# Patient Record
Sex: Female | Born: 1945
Health system: Southern US, Community
[De-identification: ages and names within clinical notes are randomized; demographics above are authoritative.]

## PROBLEM LIST (undated history)

## (undated) DIAGNOSIS — R011 Cardiac murmur, unspecified: Secondary | ICD-10-CM

## (undated) DIAGNOSIS — I10 Essential (primary) hypertension: Secondary | ICD-10-CM

## (undated) DIAGNOSIS — E785 Hyperlipidemia, unspecified: Secondary | ICD-10-CM

## (undated) DIAGNOSIS — T7840XA Allergy, unspecified, initial encounter: Secondary | ICD-10-CM

## (undated) DIAGNOSIS — I341 Nonrheumatic mitral (valve) prolapse: Secondary | ICD-10-CM

## (undated) DIAGNOSIS — N390 Urinary tract infection, site not specified: Secondary | ICD-10-CM

## (undated) DIAGNOSIS — Z8619 Personal history of other infectious and parasitic diseases: Secondary | ICD-10-CM

## (undated) HISTORY — PX: CATARACT EXTRACTION: SUR2

## (undated) HISTORY — DX: Nonrheumatic mitral (valve) prolapse: I34.1

## (undated) HISTORY — PX: OTHER SURGICAL HISTORY: SHX169

## (undated) HISTORY — DX: Cardiac murmur, unspecified: R01.1

## (undated) HISTORY — DX: Essential (primary) hypertension: I10

## (undated) HISTORY — DX: Allergy, unspecified, initial encounter: T78.40XA

## (undated) HISTORY — PX: TONSILLECTOMY AND ADENOIDECTOMY: SHX28

## (undated) HISTORY — DX: Hyperlipidemia, unspecified: E78.5

## (undated) HISTORY — DX: Urinary tract infection, site not specified: N39.0

## (undated) HISTORY — DX: Personal history of other infectious and parasitic diseases: Z86.19

---

## 2011-05-10 HISTORY — PX: OTHER SURGICAL HISTORY: SHX169

## 2011-08-02 DIAGNOSIS — R928 Other abnormal and inconclusive findings on diagnostic imaging of breast: Secondary | ICD-10-CM | POA: Insufficient documentation

## 2011-08-17 DIAGNOSIS — C50919 Malignant neoplasm of unspecified site of unspecified female breast: Secondary | ICD-10-CM | POA: Insufficient documentation

## 2011-09-26 ENCOUNTER — Ambulatory Visit: Payer: Self-pay | Admitting: Radiation Oncology

## 2011-10-08 ENCOUNTER — Ambulatory Visit: Payer: Self-pay | Admitting: Radiation Oncology

## 2011-10-12 LAB — CBC CANCER CENTER
Basophil #: 0.1 x10 3/mm (ref 0.0–0.1)
Eosinophil #: 0.1 x10 3/mm (ref 0.0–0.7)
Eosinophil %: 2 %
HCT: 42.5 % (ref 35.0–47.0)
Lymphocyte %: 25.3 %
MCH: 30.5 pg (ref 26.0–34.0)
MCHC: 32.4 g/dL (ref 32.0–36.0)
MCV: 94 fL (ref 80–100)
Monocyte #: 0.4 x10 3/mm (ref 0.2–0.9)
Neutrophil #: 2.8 x10 3/mm (ref 1.4–6.5)
Neutrophil %: 63.3 %
Platelet: 179 x10 3/mm (ref 150–440)
RDW: 14.2 % (ref 11.5–14.5)

## 2011-10-19 LAB — CBC CANCER CENTER
Basophil #: 0.1 x10 3/mm (ref 0.0–0.1)
Eosinophil %: 1.2 %
HGB: 14.6 g/dL (ref 12.0–16.0)
Lymphocyte #: 1 x10 3/mm (ref 1.0–3.6)
MCH: 30.6 pg (ref 26.0–34.0)
Monocyte #: 0.4 x10 3/mm (ref 0.2–0.9)
Monocyte %: 7.2 %
Platelet: 188 x10 3/mm (ref 150–440)
RBC: 4.78 10*6/uL (ref 3.80–5.20)
RDW: 13.9 % (ref 11.5–14.5)
WBC: 5.4 x10 3/mm (ref 3.6–11.0)

## 2011-10-26 LAB — CBC CANCER CENTER
Basophil #: 0.1 x10 3/mm (ref 0.0–0.1)
Eosinophil %: 1.9 %
HGB: 13.8 g/dL (ref 12.0–16.0)
Lymphocyte #: 0.9 x10 3/mm — ABNORMAL LOW (ref 1.0–3.6)
Lymphocyte %: 20 %
MCH: 31.1 pg (ref 26.0–34.0)
MCHC: 32.7 g/dL (ref 32.0–36.0)
MCV: 95 fL (ref 80–100)
Monocyte #: 0.3 x10 3/mm (ref 0.2–0.9)
Neutrophil #: 3.3 x10 3/mm (ref 1.4–6.5)
Neutrophil %: 69.6 %
RBC: 4.44 10*6/uL (ref 3.80–5.20)
RDW: 14.2 % (ref 11.5–14.5)
WBC: 4.7 x10 3/mm (ref 3.6–11.0)

## 2011-11-02 LAB — CBC CANCER CENTER
Basophil #: 0.1 x10 3/mm (ref 0.0–0.1)
Eosinophil #: 0.1 x10 3/mm (ref 0.0–0.7)
Eosinophil %: 3.7 %
HCT: 44.8 % (ref 35.0–47.0)
HGB: 14.6 g/dL (ref 12.0–16.0)
Lymphocyte %: 19.4 %
Monocyte #: 0.4 x10 3/mm (ref 0.2–0.9)
Monocyte %: 10.9 %
RDW: 14 % (ref 11.5–14.5)
WBC: 3.8 x10 3/mm (ref 3.6–11.0)

## 2011-11-07 ENCOUNTER — Ambulatory Visit: Payer: Self-pay | Admitting: Radiation Oncology

## 2011-11-09 LAB — CBC CANCER CENTER
Basophil #: 0.1 x10 3/mm (ref 0.0–0.1)
Eosinophil #: 0.1 x10 3/mm (ref 0.0–0.7)
Lymphocyte #: 0.8 x10 3/mm — ABNORMAL LOW (ref 1.0–3.6)
Lymphocyte %: 19.5 %
MCHC: 32.3 g/dL (ref 32.0–36.0)
Monocyte %: 10.8 %
Neutrophil %: 65.8 %
Platelet: 164 x10 3/mm (ref 150–440)
RDW: 14.3 % (ref 11.5–14.5)
WBC: 4.3 x10 3/mm (ref 3.6–11.0)

## 2011-11-16 LAB — CBC CANCER CENTER
Basophil #: 0 x10 3/mm (ref 0.0–0.1)
Basophil %: 1.3 %
Eosinophil #: 0.1 x10 3/mm (ref 0.0–0.7)
HCT: 45.3 % (ref 35.0–47.0)
HGB: 14.7 g/dL (ref 12.0–16.0)
Lymphocyte #: 0.7 x10 3/mm — ABNORMAL LOW (ref 1.0–3.6)
Lymphocyte %: 19.1 %
Monocyte %: 11.4 %
Neutrophil #: 2.4 x10 3/mm (ref 1.4–6.5)
Neutrophil %: 66.1 %
Platelet: 159 x10 3/mm (ref 150–440)
RDW: 13.9 % (ref 11.5–14.5)
WBC: 3.6 x10 3/mm (ref 3.6–11.0)

## 2011-11-22 LAB — CBC CANCER CENTER
Basophil %: 1.1 %
Eosinophil %: 1.9 %
HGB: 14.5 g/dL (ref 12.0–16.0)
Lymphocyte #: 0.7 x10 3/mm — ABNORMAL LOW (ref 1.0–3.6)
MCH: 30.9 pg (ref 26.0–34.0)
MCV: 95 fL (ref 80–100)
Monocyte %: 9.3 %
Neutrophil %: 73.4 %
Platelet: 153 x10 3/mm (ref 150–440)
RBC: 4.68 10*6/uL (ref 3.80–5.20)
WBC: 4.8 x10 3/mm (ref 3.6–11.0)

## 2011-12-08 ENCOUNTER — Ambulatory Visit: Payer: Self-pay | Admitting: Radiation Oncology

## 2012-01-08 ENCOUNTER — Ambulatory Visit: Payer: Self-pay | Admitting: Radiation Oncology

## 2012-03-30 ENCOUNTER — Ambulatory Visit: Payer: Self-pay | Admitting: Radiation Oncology

## 2012-07-10 ENCOUNTER — Ambulatory Visit: Payer: Self-pay | Admitting: Radiation Oncology

## 2012-09-03 ENCOUNTER — Ambulatory Visit: Payer: Self-pay | Admitting: Radiation Oncology

## 2012-09-05 ENCOUNTER — Ambulatory Visit: Payer: Self-pay | Admitting: Radiation Oncology

## 2014-08-31 NOTE — Consult Note (Signed)
Reason for Visit: This 69 year old Female patient presents to the clinic for initial evaluation of  Breast cancer .   Referred by Dr. Ladona Horns [facility].  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 Core biopsy was recommended and performed and was positive for ductal carcinoma in situ nuclear grade 2-3 with necrosis present. Tumor was approximately 1.6 cm in greatest dimension. Tumor was ER/PR negative. Original margins were positive she underwent reexcision with clear margins the closest being 2.5 mm. She has tolerated her surgery well. She seen today for consideration of whole breast radiation. She specifically denies significant pain in the right breast cough or bone pain. She still somewhat tender from surgery.  Past, Family and Social History:   Past Medical History positive    Past Surgical History Prior benign breast biopsy in the 29s    Past Medical History Comments Early osteoporosis    Family History positive    Family History Comments Maternal aunt with breast cancer and maternal grandmother with uterine cancer    Social History noncontributory    Additional Past Medical and Surgical History Accompanied by husband today   Review of Systems:   General negative    Performance Status (ECOG) 0    Skin negative    Breast see HPI    Ophthalmologic negative    ENMT negative    Respiratory and Thorax negative    Cardiovascular  negative    Gastrointestinal negative    Genitourinary negative    Musculoskeletal negative    Neurological negative    Psychiatric negative    Hematology/Lymphatics negative    Endocrine negative    Allergic/Immunologic negative   Physical Exam:  General/Skin/HEENT:   General normal    Skin normal    Eyes normal    ENMT normal    Head and Neck normal    Additional PE Well-developed well-nourished thin female in NAD. Breasts are small. She is status post recent wide local excision of the right breast superior to the area lower nipple complex. Incision is well-healed. No dominant mass or nodularity is noted in either breast into position examined. No axillary or supraclavicular adenopathy is appreciated. Lungs are clear to A&P cardiac examination shows regular rate and rhythm.   Breasts/Resp/CV/GI/GU:   Respiratory and Thorax normal    Cardiovascular normal    Gastrointestinal normal    Genitourinary normal   MS/Neuro/Psych/Lymph:   Musculoskeletal normal    Neurological normal    Lymphatics normal   Assessment and Plan:  Impression:   stage 0 ductal carcinomas site to of the right breast status post wide local excision.  Plan:   at the present time I would favor going ahead with whole breast radiation to 5000 cGy. Would also boost or scar another 1400 cGy based on the close margin of 2.5 mm. Risks and benefits of treatment including slight skin reaction, fatigue and small inclusion superficial lung were all discussed with  the patient and her husband. They both seem to comprehend my treatment plan well. I have set her up for CT simulation later this week.patient has inquired MammoSite catheter placement although her breasts are too small for catheter placement and would not provide enough spare spacing between her chest wall and skin.  I would like to take this opportunity to thank you for allowing me to continue to participate in this patient's care.  CC  Referral:   cc: Dr. Ladona Horns [facility], Dr. Francoise Schaumann   Electronic Signatures: Armstead Peaks (MD)  (Signed 20-May-13 13:43)  Authored: HPI, Diagnosis, PFSH, ROS, Physical Exam, Encounter Assessment and Plan, CC Referring Physician   Last Updated: 20-May-13 13:43 by Armstead Peaks (MD)

## 2014-11-03 DIAGNOSIS — H524 Presbyopia: Secondary | ICD-10-CM | POA: Diagnosis not present

## 2014-11-03 DIAGNOSIS — H52223 Regular astigmatism, bilateral: Secondary | ICD-10-CM | POA: Diagnosis not present

## 2014-11-03 DIAGNOSIS — H5203 Hypermetropia, bilateral: Secondary | ICD-10-CM | POA: Diagnosis not present

## 2014-11-03 DIAGNOSIS — H2513 Age-related nuclear cataract, bilateral: Secondary | ICD-10-CM | POA: Diagnosis not present

## 2014-12-02 DIAGNOSIS — Z9889 Other specified postprocedural states: Secondary | ICD-10-CM | POA: Diagnosis not present

## 2014-12-02 DIAGNOSIS — R922 Inconclusive mammogram: Secondary | ICD-10-CM | POA: Diagnosis not present

## 2014-12-02 DIAGNOSIS — C50911 Malignant neoplasm of unspecified site of right female breast: Secondary | ICD-10-CM | POA: Diagnosis not present

## 2014-12-16 DIAGNOSIS — L570 Actinic keratosis: Secondary | ICD-10-CM | POA: Diagnosis not present

## 2014-12-16 DIAGNOSIS — L821 Other seborrheic keratosis: Secondary | ICD-10-CM | POA: Diagnosis not present

## 2014-12-16 DIAGNOSIS — X32XXXA Exposure to sunlight, initial encounter: Secondary | ICD-10-CM | POA: Diagnosis not present

## 2014-12-16 DIAGNOSIS — L578 Other skin changes due to chronic exposure to nonionizing radiation: Secondary | ICD-10-CM | POA: Diagnosis not present

## 2014-12-16 DIAGNOSIS — L738 Other specified follicular disorders: Secondary | ICD-10-CM | POA: Diagnosis not present

## 2015-03-14 DIAGNOSIS — Z23 Encounter for immunization: Secondary | ICD-10-CM | POA: Diagnosis not present

## 2015-05-21 ENCOUNTER — Emergency Department: Payer: Medicare Other

## 2015-05-21 ENCOUNTER — Encounter: Payer: Self-pay | Admitting: *Deleted

## 2015-05-21 ENCOUNTER — Emergency Department
Admission: EM | Admit: 2015-05-21 | Discharge: 2015-05-21 | Disposition: A | Discharge status: Home / Self Care | Payer: Medicare Other | Attending: Emergency Medicine | Admitting: Emergency Medicine

## 2015-05-21 DIAGNOSIS — R1013 Epigastric pain: Secondary | ICD-10-CM | POA: Diagnosis not present

## 2015-05-21 DIAGNOSIS — R1010 Upper abdominal pain, unspecified: Secondary | ICD-10-CM | POA: Diagnosis present

## 2015-05-21 DIAGNOSIS — Z880 Allergy status to penicillin: Secondary | ICD-10-CM | POA: Insufficient documentation

## 2015-05-21 DIAGNOSIS — R1033 Periumbilical pain: Secondary | ICD-10-CM | POA: Diagnosis not present

## 2015-05-21 LAB — BASIC METABOLIC PANEL
Anion gap: 8 (ref 5–15)
BUN: 19 mg/dL (ref 6–20)
CHLORIDE: 106 mmol/L (ref 101–111)
CO2: 28 mmol/L (ref 22–32)
CREATININE: 0.61 mg/dL (ref 0.44–1.00)
Calcium: 9.3 mg/dL (ref 8.9–10.3)
GFR calc non Af Amer: >60 mL/min (ref >60)
Glucose, Bld: 97 mg/dL (ref 65–99)
POTASSIUM: 3.4 mmol/L — AB (ref 3.5–5.1)
SODIUM: 142 mmol/L (ref 135–145)

## 2015-05-21 LAB — CBC
HEMATOCRIT: 43 % (ref 35.0–47.0)
Hemoglobin: 14.3 g/dL (ref 12.0–16.0)
MCH: 31.3 pg (ref 26.0–34.0)
MCHC: 33.3 g/dL (ref 32.0–36.0)
MCV: 93.9 fL (ref 80.0–100.0)
PLATELETS: 175 10*3/uL (ref 150–440)
RBC: 4.58 MIL/uL (ref 3.80–5.20)
RDW: 13.8 % (ref 11.5–14.5)
WBC: 5.5 10*3/uL (ref 3.6–11.0)

## 2015-05-21 LAB — HEPATIC FUNCTION PANEL
ALBUMIN: 4.5 g/dL (ref 3.5–5.0)
ALK PHOS: 62 U/L (ref 38–126)
ALT: 17 U/L (ref 14–54)
AST: 28 U/L (ref 15–41)
BILIRUBIN TOTAL: 0.8 mg/dL (ref 0.3–1.2)
Total Protein: 6.7 g/dL (ref 6.5–8.1)

## 2015-05-21 LAB — TROPONIN I: Troponin I: <0.03 ng/mL (ref <0.031)

## 2015-05-21 LAB — LIPASE, BLOOD: Lipase: 24 U/L (ref 11–51)

## 2015-05-21 NOTE — ED Notes (Signed)
Lab notified of add on labs.

## 2015-05-21 NOTE — ED Provider Notes (Signed)
[facility] Emergency Department Provider Note  Time seen: 4:15 PM  I have reviewed the triage vital signs and the nursing notes.   HISTORY  Chief Complaint Abdominal Pain and Gastroesophageal Reflux    HPI Maria Gonzalez is a 70 y.o. female with no past medical history who presents to the emergency department with upper abdominal discomfort which started at 1:30 PM. According to the patient she was out in her yard she had bent over and when she stood up she felt a sharp pain in her upper abdomen. States it progressed to an 8/10 pain which she describes as sharp. Since then the pain has decreased and she states this is very mild at this time. The patient states she took her blood pressure was 160/90, she became concerned and came to the emergency department for further evaluation. States the pain is very mild, 1/10 down from an 8/10 earlier. Denies any radiation into her chest. Denies any nausea or shortness of breath. Denies any recent dysuria, diarrhea or vomiting.    History reviewed. No pertinent past medical history.  There are no active problems to display for this patient.   No past surgical history on file.  No current outpatient prescriptions on file.  Allergies Penicillins and Sulfa antibiotics  History reviewed. No pertinent family history.  Social History Social History  Substance Use Topics  . Smoking status: None  . Smokeless tobacco: None  . Alcohol Use: None    Review of Systems Constitutional: Negative for fever. Cardiovascular: Negative for chest pain. Respiratory: Negative for shortness of breath. Gastrointestinal: Positive for abdominal pain in the upper abdomen, largely resolved at this time. Denies nausea, vomiting, diarrhea. Genitourinary: Negative for dysuria. Neurological: Negative for headache 10-point ROS otherwise negative.  ____________________________________________   PHYSICAL EXAM:  VITAL SIGNS: ED Triage  Vitals  Enc Vitals Group     BP 05/21/15 1503 165/77 mmHg     Pulse Rate 05/21/15 1503 101     Resp 05/21/15 1503 18     Temp 05/21/15 1503 98.8 F (37.1 C)     Temp Source 05/21/15 1503 Oral     SpO2 05/21/15 1503 98 %     Weight 05/21/15 1503 122 lb (55.339 kg)     Height 05/21/15 1503 5\' 5"  (1.651 m)     Head Cir --      Peak Flow --      Pain Score 05/21/15 1503 2     Pain Loc --      Pain Edu? --      Excl. in [place]? --     Constitutional: Alert and oriented. Well appearing and in no distress. Eyes: Normal exam ENT   Head: Normocephalic and atraumatic.   Mouth/Throat: Mucous membranes are moist. Cardiovascular: Normal rate, regular rhythm. No murmur Respiratory: Normal respiratory effort without tachypnea nor retractions. Breath sounds are clear and equal bilaterally. No wheezes/rales/rhonchi. Gastrointestinal: Soft, minimal periumbilical tenderness palpation. No rebound or guarding. Abdomen otherwise benign. Musculoskeletal: Nontender with normal range of motion in all extremities. No lower extremity edema or tenderness. Neurologic:  Normal speech and language. No gross focal neurologic deficits Skin:  Skin is warm, dry and intact.  Psychiatric: Mood and affect are normal. Speech and behavior are normal.  ____________________________________________    EKG  EKG reviewed and interpreted by myself shows sinus tachycardia 103 bpm, narrow QRS, normal axis, normal intervals, nonspecific ST changes. No ST elevations.  ____________________________________________   INITIAL IMPRESSION / ASSESSMENT AND PLAN /  ED COURSE  Pertinent labs & imaging results that were available during my care of the patient were reviewed by me and considered in my medical decision making (see chart for details).  Patient presents to the emergency department with upper abdominal pain which began at 1:30 PM after standing up abruptly in her yard. States the pain has largely resolved. We will  check labs including hepatic function panel and lipase, as well as troponin. Patient's EKG is normal besides mild tachycardia. Patient appears overall very well currently, no distress. Bedside ultrasound performed by myself shows a normal-appearing gallbladder, no stone shadows with a normal gallbladder wall thickness. Aorta evaluation shows what appears to be a normal caliber aorta without dilation.  Labs are all within normal limits. Patient states the pain is completely gone and is asking to be discharged home. It is unclear the cause of the patient's pain however her workup is reassuring, and the patient looks very well with no distress. We'll discharge home. I discussed my normal abdominal pain return precautions with the patient who is agreeable.  ____________________________________________   FINAL CLINICAL IMPRESSION(S) / ED DIAGNOSES  Abdominal pain   Harvest Dark, MD 05/21/15 [phone]

## 2015-05-21 NOTE — ED Notes (Signed)
MD at bedside. 

## 2015-05-21 NOTE — ED Notes (Signed)
Pt states upper abd pain and indigestion beginning at 1300, states she took 81 mg ASa and peptobismal, pt deneis any SOB or nausea

## 2015-05-21 NOTE — Discharge Instructions (Signed)

## 2015-06-10 DIAGNOSIS — H5203 Hypermetropia, bilateral: Secondary | ICD-10-CM | POA: Diagnosis not present

## 2015-06-10 DIAGNOSIS — H04123 Dry eye syndrome of bilateral lacrimal glands: Secondary | ICD-10-CM | POA: Diagnosis not present

## 2015-06-10 DIAGNOSIS — H2513 Age-related nuclear cataract, bilateral: Secondary | ICD-10-CM | POA: Diagnosis not present

## 2015-06-10 DIAGNOSIS — H43393 Other vitreous opacities, bilateral: Secondary | ICD-10-CM | POA: Diagnosis not present

## 2015-06-10 DIAGNOSIS — H52223 Regular astigmatism, bilateral: Secondary | ICD-10-CM | POA: Diagnosis not present

## 2015-06-10 DIAGNOSIS — H1045 Other chronic allergic conjunctivitis: Secondary | ICD-10-CM | POA: Diagnosis not present

## 2016-03-09 DIAGNOSIS — Z23 Encounter for immunization: Secondary | ICD-10-CM | POA: Diagnosis not present

## 2016-03-14 DIAGNOSIS — J988 Other specified respiratory disorders: Secondary | ICD-10-CM | POA: Diagnosis not present

## 2016-03-21 ENCOUNTER — Ambulatory Visit (INDEPENDENT_AMBULATORY_CARE_PROVIDER_SITE_OTHER): Payer: Medicare Other | Admitting: Family

## 2016-03-21 ENCOUNTER — Encounter: Payer: Self-pay | Admitting: Family

## 2016-03-21 ENCOUNTER — Telehealth: Payer: Self-pay | Admitting: *Deleted

## 2016-03-21 VITALS — BP 152/70 | HR 92 | Temp 98.6°F | Ht 65.0 in | Wt 125.2 lb

## 2016-03-21 DIAGNOSIS — Z7689 Persons encountering health services in other specified circumstances: Secondary | ICD-10-CM

## 2016-03-21 DIAGNOSIS — Z0000 Encounter for general adult medical examination without abnormal findings: Secondary | ICD-10-CM | POA: Insufficient documentation

## 2016-03-21 NOTE — Telephone Encounter (Signed)
Please advise 

## 2016-03-21 NOTE — Progress Notes (Signed)
Subjective:    Patient ID: Maria Gonzalez, female    DOB: 07/30/1945, 70 y.o.   MRN: [id number]  CC: Maria Gonzalez is a 70 y.o. female who presents today to establish care.    HPI: Patient presents as new patient to establish care. Hasn't had recent care since prior PCP , Dr. Yetta Flock retired.   Feeling well today. BP at Urgent care last week 120/80 per patient. Has never been on medication for blood pressure. 'Nervous about appointment today.'  Denies exertional chest pain or pressure, numbness or tingling radiating to left arm or jaw, palpitations, dizziness, frequent headaches, changes in vision, or shortness of breath.          HISTORY:  Past Medical History:  Diagnosis Date  . Allergy   . Heart murmur   . History of chicken pox   . Hyperlipidemia   . UTI (urinary tract infection)    Past Surgical History:  Procedure Laterality Date  . right breast biopsy  2013   right breast calcification s/p radiation  . TONSILLECTOMY AND ADENOIDECTOMY     Family History  Problem Relation Age of Onset  . Hypertension Mother   . Stroke Mother   . Heart disease Father   . Cervical cancer Maternal Grandmother 60  . Stroke Maternal Grandfather     Allergies: Penicillins and Sulfa antibiotics No current outpatient prescriptions on file prior to visit.   No current facility-administered medications on file prior to visit.     Social History  Substance Use Topics  . Smoking status: Never Smoker  . Smokeless tobacco: Never Used  . Alcohol use No    Review of Systems  Constitutional: Negative for chills and fever.  Eyes: Negative for visual disturbance.  Respiratory: Negative for cough.   Cardiovascular: Negative for chest pain and palpitations.  Gastrointestinal: Negative for nausea and vomiting.  Neurological: Negative for dizziness and headaches.      Objective:    BP (!) 152/70   Pulse 92   Temp 98.6 F (37 C) (Oral)   Ht 5\' 5"  (1.651 m)   Wt 125 lb 3.2 oz (56.8 kg)    SpO2 97%   BMI 20.83 kg/m  BP Readings from Last 3 Encounters:  03/21/16 (!) 152/70  05/21/15 (!) 143/71   Wt Readings from Last 3 Encounters:  03/21/16 125 lb 3.2 oz (56.8 kg)  05/21/15 122 lb (55.3 kg)    Physical Exam  Constitutional: She appears well-developed and well-nourished.  Eyes: Conjunctivae are normal.  Cardiovascular: Normal rate, regular rhythm, normal heart sounds and normal pulses.   Pulmonary/Chest: Effort normal and breath sounds normal. She has no wheezes. She has no rhonchi. She has no rales.  Neurological: She is alert.  Skin: Skin is warm and dry.  Psychiatric: She has a normal mood and affect. Her speech is normal and behavior is normal. Thought content normal.  Vitals reviewed.      Assessment & Plan:   Problem List Items Addressed This Visit      Other   Encounter to establish care - Primary    Reviewed past medical and family history with patient. Blood pressure was mildly elevated today. Advised patient to use blood pressure cuff at home and monitor daily for the next week and call me with values. She verbalized understanding. F/u with CPE and labs. Due for colonoscopy and mammogram. Both ordered today.      Relevant Orders   MM DIGITAL SCREENING BILATERAL   Ambulatory referral  to Gastroenterology       I am having Ms. Pilgrim maintain her acetaminophen, Magnesium, Fish Oil, Vitamin B-12 CR, Calcium Carbonate-Vitamin D, multivitamin, aspirin EC, and vitamin E.   Meds ordered this encounter  Medications  . acetaminophen (TYLENOL) 325 MG tablet    Sig: Take 325 mg by mouth.  . Magnesium 250 MG TABS    Sig: Frequency:PRN   Dosage:0.0     Instructions:  Note:Dose: 1-2 TABS  . Omega-3 Fatty Acids (FISH OIL) 1000 MG CAPS    Sig: Take by mouth.  . Cyanocobalamin (VITAMIN B-12 CR) 1000 MCG TBCR    Sig: Take by mouth.  . Calcium Carbonate-Vitamin D 600-400 MG-UNIT tablet    Sig: Take by mouth.  . Multiple Vitamin (MULTIVITAMIN) capsule     Sig: Take 1 capsule by mouth daily.  Marland Kitchen aspirin EC 81 MG tablet    Sig: Take 81 mg by mouth daily.  . vitamin E 400 UNIT capsule    Sig: Take 400 Units by mouth daily.    Return precautions given.   Risks, benefits, and alternatives of the medications and treatment plan prescribed today were discussed, and patient expressed understanding.   Education regarding symptom management and diagnosis given to patient on AVS.  Continue to follow with Mable Paris, FNP for routine health maintenance.   Jeannette How and I agreed with plan.   Mable Paris, FNP

## 2016-03-21 NOTE — Telephone Encounter (Signed)
Pt requested to have her diagnostic mammogram order faxed to [facility]

## 2016-03-21 NOTE — Assessment & Plan Note (Addendum)
Reviewed past medical and family history with patient. Blood pressure was mildly elevated today. Advised patient to use blood pressure cuff at home and monitor daily for the next week and call me with values. She verbalized understanding. F/u with CPE and labs. Due for colonoscopy and mammogram. Both ordered today.

## 2016-03-21 NOTE — Progress Notes (Signed)
Pre visit review using our clinic review tool, if applicable. No additional management support is needed unless otherwise documented below in the visit note. 

## 2016-03-21 NOTE — Patient Instructions (Addendum)
Please return for physical  Watch BP, goal < 140/90.  Call me with values over a week.

## 2016-03-23 ENCOUNTER — Telehealth: Payer: Self-pay

## 2016-03-23 DIAGNOSIS — L820 Inflamed seborrheic keratosis: Secondary | ICD-10-CM | POA: Diagnosis not present

## 2016-03-23 DIAGNOSIS — X32XXXA Exposure to sunlight, initial encounter: Secondary | ICD-10-CM | POA: Diagnosis not present

## 2016-03-23 DIAGNOSIS — L570 Actinic keratosis: Secondary | ICD-10-CM | POA: Diagnosis not present

## 2016-03-23 DIAGNOSIS — R928 Other abnormal and inconclusive findings on diagnostic imaging of breast: Secondary | ICD-10-CM

## 2016-03-23 DIAGNOSIS — L738 Other specified follicular disorders: Secondary | ICD-10-CM | POA: Diagnosis not present

## 2016-03-23 DIAGNOSIS — Z1231 Encounter for screening mammogram for malignant neoplasm of breast: Secondary | ICD-10-CM

## 2016-03-23 DIAGNOSIS — L538 Other specified erythematous conditions: Secondary | ICD-10-CM | POA: Diagnosis not present

## 2016-03-23 DIAGNOSIS — L298 Other pruritus: Secondary | ICD-10-CM | POA: Diagnosis not present

## 2016-03-23 NOTE — Telephone Encounter (Signed)
Needed correct order placed to be faxed. Please advise.

## 2016-03-24 ENCOUNTER — Encounter: Payer: Self-pay | Admitting: Gastroenterology

## 2016-04-04 DIAGNOSIS — C50919 Malignant neoplasm of unspecified site of unspecified female breast: Secondary | ICD-10-CM | POA: Diagnosis not present

## 2016-04-04 DIAGNOSIS — R928 Other abnormal and inconclusive findings on diagnostic imaging of breast: Secondary | ICD-10-CM | POA: Diagnosis not present

## 2016-04-04 DIAGNOSIS — R922 Inconclusive mammogram: Secondary | ICD-10-CM | POA: Diagnosis not present

## 2016-04-04 LAB — HM MAMMOGRAPHY

## 2016-04-05 ENCOUNTER — Ambulatory Visit: Payer: Medicare Other | Admitting: Gastroenterology

## 2016-04-06 ENCOUNTER — Encounter: Payer: Self-pay | Admitting: Family

## 2016-04-06 NOTE — Telephone Encounter (Signed)
Patient has already been informed today by [facility].

## 2016-04-06 NOTE — Telephone Encounter (Signed)
Please call pt and let know mammogram benign. Radiologist notes dense breasts. No suspicious masses She should get report from [facility] as well; let us know if not.

## 2016-04-22 ENCOUNTER — Ambulatory Visit: Payer: Medicare Other | Admitting: Gastroenterology

## 2016-05-03 ENCOUNTER — Telehealth: Payer: Self-pay | Admitting: Family

## 2016-05-03 DIAGNOSIS — Z1239 Encounter for other screening for malignant neoplasm of breast: Secondary | ICD-10-CM

## 2016-05-03 NOTE — Telephone Encounter (Signed)
Pt needs a order in for a mammo for November 2018 at [facility]. Thank you!

## 2016-05-03 NOTE — Telephone Encounter (Signed)
Please advise 

## 2016-05-07 NOTE — Telephone Encounter (Signed)
Let pt know I have ordered

## 2016-05-20 NOTE — Telephone Encounter (Signed)
FYI..Pt called and would like to have a shingles vaccination on her appointment with Joycelyn Schmid on 1/25.

## 2016-05-21 NOTE — Telephone Encounter (Signed)
fyi

## 2016-05-30 NOTE — Progress Notes (Deleted)
Subjective:    Patient ID: Maria Gonzalez, female    DOB: 01-22-1946, 71 y.o.   MRN: [id number]  CC: Maria Gonzalez is a 71 y.o. female who presents today for physical exam.    HPI: Desires shingles vaccine    Colorectal Cancer Screening: UTD *** Breast Cancer Screening: Mammogram UTD*** Cervical Cancer Screening: UTD*** Bone Health screening/DEXA for 65+: No increased fracture risk. Defer screening at this time.*** Lung Cancer Screening: Doesn't have 30 year pack year history and age > 41 years.*** Immunizations       Tetanus -         Pneumococcal - Candidate for. ***       Zoster- Hepatitis C screening - Candidate for*** HIV Screening- Candidate for *** Labs: Screening labs today. Exercise: Gets regular exercise.  Alcohol use: *** Smoking/tobacco use: Nonsmoker.  Regular dental exams: In need of dental exam.*** Wears seat belt: Yes.  HISTORY:  Past Medical History:  Diagnosis Date  . Allergy   . Heart murmur   . History of chicken pox   . Hyperlipidemia   . UTI (urinary tract infection)     Past Surgical History:  Procedure Laterality Date  . right breast biopsy  2013   right breast calcification s/p radiation  . TONSILLECTOMY AND ADENOIDECTOMY     Family History  Problem Relation Age of Onset  . Hypertension Mother   . Stroke Mother   . Heart disease Father   . Cervical cancer Maternal Grandmother 60  . Stroke Maternal Grandfather       ALLERGIES: Penicillins and Sulfa antibiotics  Current Outpatient Prescriptions on File Prior to Visit  Medication Sig Dispense Refill  . acetaminophen (TYLENOL) 325 MG tablet Take 325 mg by mouth.    Marland Kitchen aspirin EC 81 MG tablet Take 81 mg by mouth daily.    . Calcium Carbonate-Vitamin D 600-400 MG-UNIT tablet Take by mouth.    . Cyanocobalamin (VITAMIN B-12 CR) 1000 MCG TBCR Take by mouth.    . Magnesium 250 MG TABS Frequency:PRN   Dosage:0.0     Instructions:  Note:Dose: 1-2 TABS    . Multiple Vitamin (MULTIVITAMIN)  capsule Take 1 capsule by mouth daily.    . Omega-3 Fatty Acids (FISH OIL) 1000 MG CAPS Take by mouth.    . vitamin E 400 UNIT capsule Take 400 Units by mouth daily.     No current facility-administered medications on file prior to visit.     Social History  Substance Use Topics  . Smoking status: Never Smoker  . Smokeless tobacco: Never Used  . Alcohol use No    Review of Systems    Objective:    There were no vitals taken for this visit.  BP Readings from Last 3 Encounters:  03/21/16 (!) 152/70  05/21/15 (!) 143/71   Wt Readings from Last 3 Encounters:  03/21/16 125 lb 3.2 oz (56.8 kg)  05/21/15 122 lb (55.3 kg)    Physical Exam     Assessment & Plan:   Problem List Items Addressed This Visit    None       I am having Ms. Westling maintain her acetaminophen, Magnesium, Fish Oil, Vitamin B-12 CR, Calcium Carbonate-Vitamin D, multivitamin, aspirin EC, and vitamin E.   No orders of the defined types were placed in this encounter.   Return precautions given.   Risks, benefits, and alternatives of the medications and treatment plan prescribed today were discussed, and patient expressed understanding.   Education regarding  symptom management and diagnosis given to patient on AVS.   Continue to follow with Mable Paris, FNP for routine health maintenance.   Jeannette How and I agreed with plan.   Mable Paris, FNP

## 2016-06-02 ENCOUNTER — Encounter: Payer: Medicare Other | Admitting: Family

## 2016-06-02 ENCOUNTER — Inpatient Hospital Stay
Admission: RE | Admit: 2016-06-02 | Discharge: 2016-06-02 | Disposition: A | Discharge status: Home / Self Care | Payer: Self-pay | Source: Ambulatory Visit | Attending: *Deleted | Admitting: *Deleted

## 2016-06-02 ENCOUNTER — Ambulatory Visit (INDEPENDENT_AMBULATORY_CARE_PROVIDER_SITE_OTHER): Payer: Medicare Other | Admitting: Family

## 2016-06-02 ENCOUNTER — Other Ambulatory Visit (HOSPITAL_COMMUNITY)
Admission: RE | Admit: 2016-06-02 | Discharge: 2016-06-02 | Disposition: A | Discharge status: Home / Self Care | Payer: Medicare Other | Source: Ambulatory Visit | Attending: Family | Admitting: Family

## 2016-06-02 ENCOUNTER — Encounter: Payer: Self-pay | Admitting: Family

## 2016-06-02 ENCOUNTER — Other Ambulatory Visit: Payer: Self-pay | Admitting: *Deleted

## 2016-06-02 VITALS — BP 146/62 | HR 82 | Temp 98.2°F | Resp 16 | Ht 65.0 in | Wt 121.1 lb

## 2016-06-02 DIAGNOSIS — Z0000 Encounter for general adult medical examination without abnormal findings: Secondary | ICD-10-CM | POA: Diagnosis not present

## 2016-06-02 DIAGNOSIS — Z9289 Personal history of other medical treatment: Secondary | ICD-10-CM

## 2016-06-02 DIAGNOSIS — Z1151 Encounter for screening for human papillomavirus (HPV): Secondary | ICD-10-CM | POA: Insufficient documentation

## 2016-06-02 DIAGNOSIS — I10 Essential (primary) hypertension: Secondary | ICD-10-CM

## 2016-06-02 DIAGNOSIS — Z01419 Encounter for gynecological examination (general) (routine) without abnormal findings: Secondary | ICD-10-CM | POA: Insufficient documentation

## 2016-06-02 DIAGNOSIS — F411 Generalized anxiety disorder: Secondary | ICD-10-CM | POA: Diagnosis not present

## 2016-06-02 MED ORDER — METOPROLOL SUCCINATE ER 25 MG PO TB24: 25.0000 mg | ORAL_TABLET | Freq: Every day | ORAL | 3 refills | Status: DC

## 2016-06-02 NOTE — Assessment & Plan Note (Signed)
No family history of colon cancer or personal history of polyps. Patient is a good candidate for cologuard especially she is unable to colonoscopy due to her husband being sick right now. Mammogram up-to-date. Patient declined breast exam today. Pap performed. DEXA ordered. No smoking history. Advised patient to have her Prevnar 13 and zoster vaccines at her local pharmacy as we have some concern if it would be  covered during this visit. Encouraged a walking program.

## 2016-06-02 NOTE — Progress Notes (Signed)
Pre visit review using our clinic review tool, if applicable. No additional management support is needed unless otherwise documented below in the visit note. 

## 2016-06-02 NOTE — Progress Notes (Addendum)
Subjective:    Patient ID: Maria Gonzalez, female    DOB: 08-22-1945, 71 y.o.   MRN: [id number]  CC: Maria Gonzalez is a 71 y.o. female who presents today for physical exam.    HPI: Overall feeling well. Notes some stress with husband being sick. No thoughts of hurting herself or anyone else. Prefers to not to medication.  Blood pressures at home have been averaging between 129/76-138/67. No chest pain, shortness of breath. Heart rate has been averaging 71    Colorectal Cancer Screening: due; prefers cologuard. No h/o  Breast Cancer Screening: Mammogram UTD, 03/2016 Cervical Cancer Screening: due; last 2013, normal. He[place] screening/DEXA for 65+: Last dexa 2013, unsure of results.  Lung Cancer Screening: Doesn't have 30 year pack year history and age > 7 years. Immunizations       Tetanus - due        Pneumococcal - Candidate for.  Hepatitis C screening - Candidate for  Labs: Screening labs today. Exercise: Gets regular exercise, walking  Alcohol use: none Smoking/tobacco use: Nonsmoker.   Wears seat belt: Yes.  HISTORY:  Past Medical History:  Diagnosis Date  . Allergy   . Heart murmur   . History of chicken pox   . Hyperlipidemia   . UTI (urinary tract infection)     Past Surgical History:  Procedure Laterality Date  . right breast biopsy  2013   right breast calcification s/p radiation  . TONSILLECTOMY AND ADENOIDECTOMY     Family History  Problem Relation Age of Onset  . Hypertension Mother   . Stroke Mother   . Heart disease Father   . Cervical cancer Maternal Grandmother 60  . Stroke Maternal Grandfather   . Colon cancer Neg Hx       ALLERGIES: Penicillins and Sulfa antibiotics  Current Outpatient Prescriptions on File Prior to Visit  Medication Sig Dispense Refill  . acetaminophen (TYLENOL) 325 MG tablet Take 325 mg by mouth.    Marland Kitchen aspirin EC 81 MG tablet Take 81 mg by mouth daily.    . Calcium Carbonate-Vitamin D 600-400 MG-UNIT tablet Take  by mouth.    . Cyanocobalamin (VITAMIN B-12 CR) 1000 MCG TBCR Take by mouth.    . Magnesium 250 MG TABS Frequency:PRN   Dosage:0.0     Instructions:  Note:Dose: 1-2 TABS    . Multiple Vitamin (MULTIVITAMIN) capsule Take 1 capsule by mouth daily.    . Omega-3 Fatty Acids (FISH OIL) 1000 MG CAPS Take by mouth.    . vitamin E 400 UNIT capsule Take 400 Units by mouth daily.     No current facility-administered medications on file prior to visit.     Social History  Substance Use Topics  . Smoking status: Never Smoker  . Smokeless tobacco: Never Used  . Alcohol use No    Review of Systems  Constitutional: Negative for chills, fever and unexpected weight change.  HENT: Negative for congestion.   Respiratory: Negative for cough.   Cardiovascular: Negative for chest pain, palpitations and leg swelling.  Gastrointestinal: Negative for nausea and vomiting.  Musculoskeletal: Negative for arthralgias and myalgias.  Skin: Negative for rash.  Neurological: Negative for headaches.  Hematological: Negative for adenopathy.  Psychiatric/Behavioral: Negative for confusion.      Objective:    BP (!) 146/62   Pulse 82   Temp 98.2 F (36.8 C) (Oral)   Resp 16   Ht 5\' 5"  (1.651 m)   Wt 121 lb 2 oz (  54.9 kg)   SpO2 96%   BMI 20.16 kg/m   BP Readings from Last 3 Encounters:  06/02/16 (!) 146/62  03/21/16 (!) 152/70  05/21/15 (!) 143/71   Wt Readings from Last 3 Encounters:  06/02/16 121 lb 2 oz (54.9 kg)  03/21/16 125 lb 3.2 oz (56.8 kg)  05/21/15 122 lb (55.3 kg)    Physical Exam  Constitutional: She appears well-developed and well-nourished.  Eyes: Conjunctivae are normal.  Neck: No thyroid mass and no thyromegaly present.  Cardiovascular: Normal rate, regular rhythm, normal heart sounds and normal pulses.   Pulmonary/Chest: Effort normal and breath sounds normal. She has no wheezes. She has no rhonchi. She has no rales.  Declines cbe  Genitourinary: Uterus is not enlarged, not  fixed and not tender. Cervix exhibits no motion tenderness, no discharge and no friability. Right adnexum displays no mass, no tenderness and no fullness. Left adnexum displays no mass, no tenderness and no fullness.  Genitourinary Comments: Pap performed. No CMT. Unable to appreciated ovaries.  Lymphadenopathy:       Head (right side): No submental, no submandibular, no tonsillar, no preauricular, no posterior auricular and no occipital adenopathy present.       Head (left side): No submental, no submandibular, no tonsillar, no preauricular, no posterior auricular and no occipital adenopathy present.       Right cervical: No superficial cervical, no deep cervical and no posterior cervical adenopathy present.      Left cervical: No superficial cervical, no deep cervical and no posterior cervical adenopathy present.    She has no axillary adenopathy.       Right axillary: No pectoral and no lateral adenopathy present.       Left axillary: No pectoral and no lateral adenopathy present. Neurological: She is alert.  Skin: Skin is warm and dry.  Psychiatric: She has a normal mood and affect. Her speech is normal and behavior is normal. Thought content normal.  Vitals reviewed.      Assessment & Plan:   Problem List Items Addressed This Visit      Cardiovascular and Mediastinum   HTN (hypertension) - Primary    New. Elevated. We'll start low-dose of Toprol. Follow-up in 6 weeks. This will also help with anxiety.      Relevant Medications   metoprolol succinate (TOPROL-XL) 25 MG 24 hr tablet     Other   Routine physical examination    No family history of colon cancer or personal history of polyps. Patient is a good candidate for cologuard especially she is unable to colonoscopy due to her husband being sick right now. Mammogram up-to-date. Patient declined breast exam today. Pap performed. DEXA ordered. No smoking history. Advised patient to have her Prevnar 13 and zoster vaccines at her  local pharmacy as we have some concern if it would be  covered during this visit. Encouraged a walking program.      Relevant Orders   CBC with Differential/Platelet   Comprehensive metabolic panel   Hemoglobin A1c   Hepatitis C antibody   Lipid panel   TSH   VITAMIN D 25 Hydroxy (Vit-D Deficiency, Fractures)   Cytology - PAP   Generalized anxiety disorder    Largely related to being a caregiver. Politely declines any referral to counseling or medication at this time.      Relevant Orders   CBC with Differential/Platelet   Comprehensive metabolic panel   Hemoglobin A1c   Hepatitis C antibody   Lipid panel  TSH   VITAMIN D 25 Hydroxy (Vit-D Deficiency, Fractures)   Cytology - PAP   DG Bone Density       I am having Ms. Reifschneider start on metoprolol succinate. I am also having her maintain her acetaminophen, Magnesium, Fish Oil, Vitamin B-12 CR, Calcium Carbonate-Vitamin D, multivitamin, aspirin EC, and vitamin E.   Meds ordered this encounter  Medications  . metoprolol succinate (TOPROL-XL) 25 MG 24 hr tablet    Sig: Take 1 tablet (25 mg total) by mouth daily.    Dispense:  90 tablet    Refill:  3    Order Specific Question:   Supervising Provider    Answer:   Crecencio Mc [[id number]]    Return precautions given.   Risks, benefits, and alternatives of the medications and treatment plan prescribed today were discussed, and patient expressed understanding.   Education regarding symptom management and diagnosis given to patient on AVS.   Continue to follow with Mable Paris, FNP for routine health maintenance.   Jeannette How and I agreed with plan.   Mable Paris, FNP

## 2016-06-02 NOTE — Assessment & Plan Note (Addendum)
New. Elevated. We'll start low-dose of Toprol. Follow-up in 6 weeks. This will also help with anxiety.

## 2016-06-02 NOTE — Assessment & Plan Note (Signed)
Largely related to being a caregiver. Politely declines any referral to counseling or medication at this time.

## 2016-06-02 NOTE — Patient Instructions (Addendum)
Zoster ( shingles) vaccine and then one month  later the Prevnar-13 ( pneumonia) vaccine.   One year later, you will need pneumonococcal-23 vaccine   Labs today  Cologuard  Bone density scan  Start toprol; monitor blood pressure  Health Maintenance, Female Introduction Adopting a healthy lifestyle and getting preventive care can go a long way to promote health and wellness. Talk with your health care provider about what schedule of regular examinations is right for you. This is a good chance for you to check in with your provider about disease prevention and staying healthy. In between checkups, there are plenty of things you can do on your own. Experts have done a lot of research about which lifestyle changes and preventive measures are most likely to keep you healthy. Ask your health care provider for more information. Weight and diet Eat a healthy diet  Be sure to include plenty of vegetables, fruits, low-fat dairy products, and lean protein.  Do not eat a lot of foods high in solid fats, added sugars, or salt.  Get regular exercise. This is one of the most important things you can do for your health.  Most adults should exercise for at least 150 minutes each week. The exercise should increase your heart rate and make you sweat (moderate-intensity exercise).  Most adults should also do strengthening exercises at least twice a week. This is in addition to the moderate-intensity exercise. Maintain a healthy weight  Body mass index (BMI) is a measurement that can be used to identify possible weight problems. It estimates body fat based on height and weight. Your health care provider can help determine your BMI and help you achieve or maintain a healthy weight.  For females 7 years of age and older:  A BMI below 18.5 is considered underweight.  A BMI of 18.5 to 24.9 is normal.  A BMI of 25 to 29.9 is considered overweight.  A BMI of 30 and above is considered obese. Watch  levels of cholesterol and blood lipids  You should start having your blood tested for lipids and cholesterol at 70 years of age, then have this test every 5 years.  You may need to have your cholesterol levels checked more often if:  Your lipid or cholesterol levels are high.  You are older than 70 years of age.  You are at high risk for heart disease. Cancer screening Lung Cancer  Lung cancer screening is recommended for adults 22-81 years old who are at high risk for lung cancer because of a history of smoking.  A yearly low-dose CT scan of the lungs is recommended for people who:  Currently smoke.  Have quit within the past 15 years.  Have at least a 30-pack-year history of smoking. A pack year is smoking an average of one pack of cigarettes a day for 1 year.  Yearly screening should continue until it has been 15 years since you quit.  Yearly screening should stop if you develop a health problem that would prevent you from having lung cancer treatment. Breast Cancer  Practice breast self-awareness. This means understanding how your breasts normally appear and feel.  It also means doing regular breast self-exams. Let your health care provider know about any changes, no matter how small.  If you are in your 20s or 30s, you should have a clinical breast exam (CBE) by a health care provider every 1-3 years as part of a regular health exam.  If you are 40 or older, have  a CBE every year. Also consider having a breast X-ray (mammogram) every year.  If you have a family history of breast cancer, talk to your health care provider about genetic screening.  If you are at high risk for breast cancer, talk to your health care provider about having an MRI and a mammogram every year.  Breast cancer gene (BRCA) assessment is recommended for women who have family members with BRCA-related cancers. BRCA-related cancers include:  Breast.  Ovarian.  Tubal.  Peritoneal  cancers.  Results of the assessment will determine the need for genetic counseling and BRCA1 and BRCA2 testing. Cervical Cancer  Your health care provider may recommend that you be screened regularly for cancer of the pelvic organs (ovaries, uterus, and vagina). This screening involves a pelvic examination, including checking for microscopic changes to the surface of your cervix (Pap test). You may be encouraged to have this screening done every 3 years, beginning at age 26.  For women ages 40-65, health care providers may recommend pelvic exams and Pap testing every 3 years, or they may recommend the Pap and pelvic exam, combined with testing for human papilloma virus (HPV), every 5 years. Some types of HPV increase your risk of cervical cancer. Testing for HPV may also be done on women of any age with unclear Pap test results.  Other health care providers may not recommend any screening for nonpregnant women who are considered low risk for pelvic cancer and who do not have symptoms. Ask your health care provider if a screening pelvic exam is right for you.  If you have had past treatment for cervical cancer or a condition that could lead to cancer, you need Pap tests and screening for cancer for at least 20 years after your treatment. If Pap tests have been discontinued, your risk factors (such as having a new sexual partner) need to be reassessed to determine if screening should resume. Some women have medical problems that increase the chance of getting cervical cancer. In these cases, your health care provider may recommend more frequent screening and Pap tests. Colorectal Cancer  This type of cancer can be detected and often prevented.  Routine colorectal cancer screening usually begins at 70 years of age and continues through 70 years of age.  Your health care provider may recommend screening at an earlier age if you have risk factors for colon cancer.  Your health care provider may also  recommend using home test kits to check for hidden blood in the stool.  A small camera at the end of a tube can be used to examine your colon directly (sigmoidoscopy or colonoscopy). This is done to check for the earliest forms of colorectal cancer.  Routine screening usually begins at age 66.  Direct examination of the colon should be repeated every 5-10 years through 70 years of age. However, you may need to be screened more often if early forms of precancerous polyps or small growths are found. Skin Cancer  Check your skin from head to toe regularly.  Tell your health care provider about any new moles or changes in moles, especially if there is a change in a mole's shape or color.  Also tell your health care provider if you have a mole that is larger than the size of a pencil eraser.  Always use sunscreen. Apply sunscreen liberally and repeatedly throughout the day.  Protect yourself by wearing long sleeves, pants, a wide-brimmed hat, and sunglasses whenever you are outside. Heart disease, diabetes, and  high blood pressure  High blood pressure causes heart disease and increases the risk of stroke. High blood pressure is more likely to develop in:  People who have blood pressure in the high end of the normal range (130-139/85-89 mm Hg).  People who are overweight or obese.  People who are African American.  If you are 16-23 years of age, have your blood pressure checked every 3-5 years. If you are 25 years of age or older, have your blood pressure checked every year. You should have your blood pressure measured twice-once when you are at a hospital or clinic, and once when you are not at a hospital or clinic. Record the average of the two measurements. To check your blood pressure when you are not at a hospital or clinic, you can use:  An automated blood pressure machine at a pharmacy.  A home blood pressure monitor.  If you are between 53 years and 75 years old, ask your health  care provider if you should take aspirin to prevent strokes.  Have regular diabetes screenings. This involves taking a blood sample to check your fasting blood sugar level.  If you are at a normal weight and have a low risk for diabetes, have this test once every three years after 70 years of age.  If you are overweight and have a high risk for diabetes, consider being tested at a younger age or more often. Preventing infection Hepatitis B  If you have a higher risk for hepatitis B, you should be screened for this virus. You are considered at high risk for hepatitis B if:  You were born in a country where hepatitis B is common. Ask your health care provider which countries are considered high risk.  Your parents were born in a high-risk country, and you have not been immunized against hepatitis B (hepatitis B vaccine).  You have HIV or AIDS.  You use needles to inject street drugs.  You live with someone who has hepatitis B.  You have had sex with someone who has hepatitis B.  You get hemodialysis treatment.  You take certain medicines for conditions, including cancer, organ transplantation, and autoimmune conditions. Hepatitis C  Blood testing is recommended for:  Everyone born from 18 through 1965.  Anyone with known risk factors for hepatitis C. Sexually transmitted infections (STIs)  You should be screened for sexually transmitted infections (STIs) including gonorrhea and chlamydia if:  You are sexually active and are younger than 70 years of age.  You are older than 70 years of age and your health care provider tells you that you are at risk for this type of infection.  Your sexual activity has changed since you were last screened and you are at an increased risk for chlamydia or gonorrhea. Ask your health care provider if you are at risk.  If you do not have HIV, but are at risk, it may be recommended that you take a prescription medicine daily to prevent HIV  infection. This is called pre-exposure prophylaxis (PrEP). You are considered at risk if:  You are sexually active and do not regularly use condoms or know the HIV status of your partner(s).  You take drugs by injection.  You are sexually active with a partner who has HIV. Talk with your health care provider about whether you are at high risk of being infected with HIV. If you choose to begin PrEP, you should first be tested for HIV. You should then be tested every 3  months for as long as you are taking PrEP. Pregnancy  If you are premenopausal and you may become pregnant, ask your health care provider about preconception counseling.  If you may become pregnant, take 400 to 800 micrograms (mcg) of folic acid every day.  If you want to prevent pregnancy, talk to your health care provider about birth control (contraception). Osteoporosis and menopause  Osteoporosis is a disease in which the bones lose minerals and strength with aging. This can result in serious bone fractures. Your risk for osteoporosis can be identified using a bone density scan.  If you are 53 years of age or older, or if you are at risk for osteoporosis and fractures, ask your health care provider if you should be screened.  Ask your health care provider whether you should take a calcium or vitamin D supplement to lower your risk for osteoporosis.  Menopause may have certain physical symptoms and risks.  Hormone replacement therapy may reduce some of these symptoms and risks. Talk to your health care provider about whether hormone replacement therapy is right for you. Follow these instructions at home:  Schedule regular health, dental, and eye exams.  Stay current with your immunizations.  Do not use any tobacco products including cigarettes, chewing tobacco, or electronic cigarettes.  If you are pregnant, do not drink alcohol.  If you are breastfeeding, limit how much and how often you drink alcohol.  Limit  alcohol intake to no more than 1 drink per day for nonpregnant women. One drink equals 12 ounces of beer, 5 ounces of wine, or 1 ounces of hard liquor.  Do not use street drugs.  Do not share needles.  Ask your health care provider for help if you need support or information about quitting drugs.  Tell your health care provider if you often feel depressed.  Tell your health care provider if you have ever been abused or do not feel safe at home. This information is not intended to replace advice given to you by your health care provider. Make sure you discuss any questions you have with your health care provider. Document Released: 11/08/2010 Document Revised: 10/01/2015 Document Reviewed: 01/27/2015  2017 [employer]

## 2016-06-03 LAB — COMPREHENSIVE METABOLIC PANEL
ALBUMIN: 4.7 g/dL (ref 3.5–5.2)
ALT: 18 U/L (ref 0–35)
AST: 24 U/L (ref 0–37)
Alkaline Phosphatase: 62 U/L (ref 39–117)
BILIRUBIN TOTAL: 0.3 mg/dL (ref 0.2–1.2)
BUN: 15 mg/dL (ref 6–23)
CALCIUM: 10 mg/dL (ref 8.4–10.5)
CO2: 33 mEq/L — ABNORMAL HIGH (ref 19–32)
CREATININE: 0.6 mg/dL (ref 0.40–1.20)
Chloride: 104 mEq/L (ref 96–112)
GFR: 105.31 mL/min (ref >60.00)
Glucose, Bld: 92 mg/dL (ref 70–99)
Potassium: 3.8 mEq/L (ref 3.5–5.1)
Sodium: 143 mEq/L (ref 135–145)
Total Protein: 6.7 g/dL (ref 6.0–8.3)

## 2016-06-03 LAB — CBC WITH DIFFERENTIAL/PLATELET
BASOS ABS: 0 10*3/uL (ref 0.0–0.1)
BASOS PCT: 0.5 % (ref 0.0–3.0)
EOS ABS: 0 10*3/uL (ref 0.0–0.7)
Eosinophils Relative: 0.9 % (ref 0.0–5.0)
HEMATOCRIT: 41.6 % (ref 36.0–46.0)
HEMOGLOBIN: 14.1 g/dL (ref 12.0–15.0)
LYMPHS PCT: 20.1 % (ref 12.0–46.0)
Lymphs Abs: 1.1 10*3/uL (ref 0.7–4.0)
MCHC: 33.9 g/dL (ref 30.0–36.0)
MCV: 93.8 fl (ref 78.0–100.0)
MONOS PCT: 5.5 % (ref 3.0–12.0)
Monocytes Absolute: 0.3 10*3/uL (ref 0.1–1.0)
NEUTROS ABS: 3.9 10*3/uL (ref 1.4–7.7)
Neutrophils Relative %: 73 % (ref 43.0–77.0)
Platelets: 169 10*3/uL (ref 150.0–400.0)
RBC: 4.44 Mil/uL (ref 3.87–5.11)
RDW: 14.2 % (ref 11.5–15.5)
WBC: 5.4 10*3/uL (ref 4.0–10.5)

## 2016-06-03 LAB — LIPID PANEL
CHOLESTEROL: 232 mg/dL — AB (ref 0–200)
HDL: 53 mg/dL (ref >39.00)
LDL Cholesterol: 161 mg/dL — ABNORMAL HIGH (ref 0–99)
NONHDL: 179.2
Total CHOL/HDL Ratio: 4
Triglycerides: 89 mg/dL (ref 0.0–149.0)
VLDL: 17.8 mg/dL (ref 0.0–40.0)

## 2016-06-03 LAB — TSH: TSH: 1.01 u[IU]/mL (ref 0.35–4.50)

## 2016-06-03 LAB — HEPATITIS C ANTIBODY: HCV AB: NEGATIVE

## 2016-06-03 LAB — VITAMIN D 25 HYDROXY (VIT D DEFICIENCY, FRACTURES): VITD: 26.3 ng/mL — ABNORMAL LOW (ref 30.00–100.00)

## 2016-06-03 LAB — HEMOGLOBIN A1C: HEMOGLOBIN A1C: 5.1 % (ref 4.6–6.5)

## 2016-06-06 ENCOUNTER — Other Ambulatory Visit: Payer: Self-pay | Admitting: Family

## 2016-06-06 DIAGNOSIS — E785 Hyperlipidemia, unspecified: Secondary | ICD-10-CM | POA: Insufficient documentation

## 2016-06-06 LAB — CYTOLOGY - PAP
DIAGNOSIS: NEGATIVE
HPV (WINDOPATH): NOT DETECTED

## 2016-06-06 MED ORDER — PRAVASTATIN SODIUM 40 MG PO TABS: 40.0000 mg | ORAL_TABLET | Freq: Every day | ORAL | 2 refills | Status: DC

## 2016-06-07 ENCOUNTER — Telehealth: Payer: Self-pay | Admitting: *Deleted

## 2016-06-07 NOTE — Telephone Encounter (Signed)
Pt requested cost to shingles vaccine

## 2016-06-16 DIAGNOSIS — Z1211 Encounter for screening for malignant neoplasm of colon: Secondary | ICD-10-CM | POA: Diagnosis not present

## 2016-06-16 DIAGNOSIS — Z1212 Encounter for screening for malignant neoplasm of rectum: Secondary | ICD-10-CM | POA: Diagnosis not present

## 2016-06-16 LAB — COLOGUARD: Cologuard: NEGATIVE

## 2016-06-27 ENCOUNTER — Other Ambulatory Visit: Payer: Self-pay | Admitting: Family

## 2016-06-27 DIAGNOSIS — E2839 Other primary ovarian failure: Secondary | ICD-10-CM

## 2016-07-01 ENCOUNTER — Encounter: Payer: Self-pay | Admitting: Family

## 2016-08-15 ENCOUNTER — Ambulatory Visit
Admission: RE | Admit: 2016-08-15 | Discharge: 2016-08-15 | Disposition: A | Discharge status: Home / Self Care | Payer: Medicare Other | Source: Ambulatory Visit | Attending: Family | Admitting: Family

## 2016-08-15 DIAGNOSIS — M810 Age-related osteoporosis without current pathological fracture: Secondary | ICD-10-CM | POA: Diagnosis not present

## 2016-08-15 DIAGNOSIS — E2839 Other primary ovarian failure: Secondary | ICD-10-CM | POA: Diagnosis not present

## 2016-08-16 ENCOUNTER — Encounter: Payer: Self-pay | Admitting: Family

## 2016-08-16 DIAGNOSIS — M810 Age-related osteoporosis without current pathological fracture: Secondary | ICD-10-CM | POA: Insufficient documentation

## 2016-08-24 ENCOUNTER — Ambulatory Visit: Payer: Medicare Other | Admitting: Family

## 2016-09-05 ENCOUNTER — Ambulatory Visit (INDEPENDENT_AMBULATORY_CARE_PROVIDER_SITE_OTHER): Payer: Medicare Other | Admitting: Family

## 2016-09-05 ENCOUNTER — Encounter: Payer: Self-pay | Admitting: Family

## 2016-09-05 VITALS — BP 164/82 | HR 89 | Temp 98.4°F | Ht 65.0 in | Wt 128.4 lb

## 2016-09-05 DIAGNOSIS — I10 Essential (primary) hypertension: Secondary | ICD-10-CM | POA: Diagnosis not present

## 2016-09-05 DIAGNOSIS — M810 Age-related osteoporosis without current pathological fracture: Secondary | ICD-10-CM

## 2016-09-05 DIAGNOSIS — I341 Nonrheumatic mitral (valve) prolapse: Secondary | ICD-10-CM | POA: Diagnosis not present

## 2016-09-05 DIAGNOSIS — E785 Hyperlipidemia, unspecified: Secondary | ICD-10-CM

## 2016-09-05 NOTE — Assessment & Plan Note (Signed)
Elevated today. Asymptomatic. Home values very much in good control. Agreed may be office setting. Patient will return for nurse visit and calibrate BP cuff to ensure accurate at home.

## 2016-09-05 NOTE — Patient Instructions (Addendum)
Please have a nurse visit to calibrate your bp cuff  As discussed, if you have any palpitations, dizziness, shortness of breath , please let me know asap as may be related to mitral valve prolapse.   For post menopausal women, guidelines recommend a diet with 1200 mg of Calcium per day. If you are eating calcium rich foods, you do not need a calcium supplement. The body better absorbs the calcium that you eat over supplementation. If you do supplement, I recommend not supplementing the full 1200 mg/ day as this can lead to increased risk of cardiovascular disease. I recommend Calcium Citrate over the counter, and you may take a total of 600 to 800 mg per day in divided doses with meals for best absorption.   For bone health, you need adequate vitamin D, and I recommend you supplement as it is harder to do so with diet alone. I recommend cholecalciferol 800 units daily.  Also, please ensure you are following a diet high in calcium -- research shows better outcomes with dietary sources including kale, yogurt, broccolii, cheese, okra, almonds- to name a few.     Also remember that exercise is a great medicine for maintain and preserve bone health. Advise moderate exercise for 30 minutes , 3 times per week.

## 2016-09-05 NOTE — Assessment & Plan Note (Signed)
Declines any pharmacologic treatment at this time. Had been fosamax in the past. Education provided on vitamin D, calcium supplementaion.

## 2016-09-05 NOTE — Progress Notes (Signed)
Subjective:    Patient ID: Maria Gonzalez, female    DOB: 04/28/1946, 71 y.o.   MRN: [id number]  CC: Maria Gonzalez is a 71 y.o. female who presents today for follow up.   HPI: HLD- would prefer lifestyle modifications  HTN- half-ed the dosage of metoprolol in the evening . At home today was 118/64 which is average.  Denies exertional chest pain or pressure, numbness or tingling radiating to left arm or jaw, palpitations, dizziness, frequent headaches, changes in vision, or shortness of breath.   Per patient, had echocardiogram years ago ( approx 10 years ago) per patient and told she had MVP. Told to take magnesium OTC which helped. No longer has palpitations which had that time. No exercise intolerance.   OP- h/o fosamax. 'scared of side effects' and never wants to take again         HISTORY:  Past Medical History:  Diagnosis Date  . Allergy   . Heart murmur   . History of chicken pox   . Hyperlipidemia   . UTI (urinary tract infection)    Past Surgical History:  Procedure Laterality Date  . right breast biopsy  2013   right breast calcification s/p radiation  . TONSILLECTOMY AND ADENOIDECTOMY     Family History  Problem Relation Age of Onset  . Hypertension Mother   . Stroke Mother   . Heart disease Father   . Cervical cancer Maternal Grandmother 60  . Stroke Maternal Grandfather   . Colon cancer Neg Hx     Allergies: Penicillins; Sulfa antibiotics; and Hydrocodone-acetaminophen Current Outpatient Prescriptions on File Prior to Visit  Medication Sig Dispense Refill  . acetaminophen (TYLENOL) 325 MG tablet Take 325 mg by mouth.    Marland Kitchen aspirin EC 81 MG tablet Take 81 mg by mouth daily.    . Calcium Carbonate-Vitamin D 600-400 MG-UNIT tablet Take by mouth.    . Cyanocobalamin (VITAMIN B-12 CR) 1000 MCG TBCR Take by mouth.    . Magnesium 250 MG TABS Frequency:PRN   Dosage:0.0     Instructions:  Note:Dose: 1-2 TABS    . metoprolol succinate (TOPROL-XL) 25 MG 24 hr tablet  Take 1 tablet (25 mg total) by mouth daily. (Patient taking differently: Take 12.5 mg by mouth daily. ) 90 tablet 3  . Multiple Vitamin (MULTIVITAMIN) capsule Take 1 capsule by mouth daily.    . Omega-3 Fatty Acids (FISH OIL) 1000 MG CAPS Take by mouth.    . vitamin E 400 UNIT capsule Take 400 Units by mouth daily.     No current facility-administered medications on file prior to visit.     Social History  Substance Use Topics  . Smoking status: Never Smoker  . Smokeless tobacco: Never Used  . Alcohol use No    Review of Systems  Constitutional: Negative for chills and fever.  Respiratory: Negative for cough and shortness of breath.   Cardiovascular: Negative for chest pain and palpitations.  Gastrointestinal: Negative for nausea and vomiting.      Objective:    BP (!) 164/82   Pulse 89   Temp 98.4 F (36.9 C) (Oral)   Ht 5\' 5"  (1.651 m)   Wt 128 lb 6.4 oz (58.2 kg)   SpO2 100%   BMI 21.37 kg/m  BP Readings from Last 3 Encounters:  09/05/16 (!) 164/82  06/02/16 (!) 146/62  03/21/16 (!) 152/70   Wt Readings from Last 3 Encounters:  09/05/16 128 lb 6.4 oz (58.2 kg)  06/02/16  121 lb 2 oz (54.9 kg)  03/21/16 125 lb 3.2 oz (56.8 kg)    Physical Exam  Constitutional: She appears well-developed and well-nourished.  Eyes: Conjunctivae are normal.  Cardiovascular: Normal rate, regular rhythm, normal heart sounds and normal pulses.   Pulmonary/Chest: Effort normal and breath sounds normal. She has no wheezes. She has no rhonchi. She has no rales.  Neurological: She is alert.  Skin: Skin is warm and dry.  Psychiatric: She has a normal mood and affect. Her speech is normal and behavior is normal. Thought content normal.  Vitals reviewed.      Assessment & Plan:   Problem List Items Addressed This Visit      Cardiovascular and Mediastinum   HTN (hypertension)    Elevated today. Asymptomatic. Home values very much in good control. Agreed may be office setting. Patient  will return for nurse visit and calibrate BP cuff to ensure accurate at home.       MVP (mitral valve prolapse)    Discussed at length prior history of palpitations, reassured as no symptoms for a long time. She politely declined repeat echo or consult with cardiology. Discussed staying vigilant, continuing to exercise, limiting caffeine.         Musculoskeletal and Integument   Osteoporosis    Declines any pharmacologic treatment at this time. Had been fosamax in the past. Education provided on vitamin D, calcium supplementaion.         Other   Hyperlipidemia    No statin therapy based on patient preference. She would prefer lifestyle modifications.           I have discontinued Maria Gonzalez's pravastatin. I am also having her maintain her acetaminophen, Magnesium, Fish Oil, Vitamin B-12 CR, Calcium Carbonate-Vitamin D, multivitamin, aspirin EC, vitamin E, and metoprolol succinate.   No orders of the defined types were placed in this encounter.   Return precautions given.   Risks, benefits, and alternatives of the medications and treatment plan prescribed today were discussed, and patient expressed understanding.   Education regarding symptom management and diagnosis given to patient on AVS.  Continue to follow with Maria Paris, FNP for routine health maintenance.   Maria Gonzalez and I agreed with plan.   Maria Paris, FNP

## 2016-09-05 NOTE — Progress Notes (Signed)
Pre visit review using our clinic review tool, if applicable. No additional management support is needed unless otherwise documented below in the visit note. 

## 2016-09-05 NOTE — Assessment & Plan Note (Signed)
Discussed at length prior history of palpitations, reassured as no symptoms for a long time. She politely declined repeat echo or consult with cardiology. Discussed staying vigilant, continuing to exercise, limiting caffeine.

## 2016-09-05 NOTE — Assessment & Plan Note (Signed)
No statin therapy based on patient preference. She would prefer lifestyle modifications.

## 2016-09-07 ENCOUNTER — Telehealth: Payer: Self-pay | Admitting: *Deleted

## 2016-09-07 ENCOUNTER — Ambulatory Visit (INDEPENDENT_AMBULATORY_CARE_PROVIDER_SITE_OTHER): Payer: Medicare Other | Admitting: *Deleted

## 2016-09-07 ENCOUNTER — Encounter: Payer: Self-pay | Admitting: *Deleted

## 2016-09-07 VITALS — BP 138/80 | HR 83 | Resp 14

## 2016-09-07 DIAGNOSIS — I10 Essential (primary) hypertension: Secondary | ICD-10-CM

## 2016-09-07 NOTE — Progress Notes (Addendum)
Patient presented for BP check with home cuff comparison, Patient BP compared to home cuff found no larger that total 8 point difference. Patient did take 12.5 mg  Metoprolol XL at 7  This am.Patient stated that her medication leaves her feeling tired and drained that before she started Metoprolol she had a high energy level and now she just feels she has no energy.  Patient wanted to know could she cut dose into quarters instead of half advised patient not to do so until she heard or spoke with PCP. BP reading as follows Left arm 134/76 pulse 78 in right arm 138/80 pulse 83 Home cuff reading to left arm 130/67 pulse 84 , right arm homecuff reading 135/77 pulse 77.  Reviewed. See prior note NOT to decrease metoprolol.

## 2016-09-07 NOTE — Progress Notes (Signed)
Left message to return my call.  

## 2016-09-07 NOTE — Telephone Encounter (Signed)
Patient stated that she missed a call from [facility]. Patient requested a return call Pt contact [phone]

## 2016-09-09 NOTE — Progress Notes (Signed)
Patient notified and voiced understanding.

## 2016-09-09 NOTE — Telephone Encounter (Signed)
See clinical note patient advised per note not to decrease BP medication.

## 2017-02-10 ENCOUNTER — Telehealth: Payer: Self-pay | Admitting: Family

## 2017-02-10 DIAGNOSIS — Z1239 Encounter for other screening for malignant neoplasm of breast: Secondary | ICD-10-CM

## 2017-02-10 NOTE — Telephone Encounter (Signed)
Pt called would like to get her mammo. Pt goes to [facility] to get mammo done. Please advise?  Call pt @ [phone]. Thank you!

## 2017-02-13 NOTE — Telephone Encounter (Signed)
Please advise 

## 2017-02-13 NOTE — Telephone Encounter (Signed)
Call pt Mammogram ordered; please confirm that last mammogram 03/2016 which was birads 2  Please advise her to make an appt at [facility]

## 2017-02-14 DIAGNOSIS — Z23 Encounter for immunization: Secondary | ICD-10-CM | POA: Diagnosis not present

## 2017-02-14 NOTE — Telephone Encounter (Signed)
Left message for patient to return call back.  

## 2017-02-16 NOTE — Telephone Encounter (Signed)
Left message for patient to return call back.  

## 2017-04-05 DIAGNOSIS — R928 Other abnormal and inconclusive findings on diagnostic imaging of breast: Secondary | ICD-10-CM | POA: Diagnosis not present

## 2017-07-09 ENCOUNTER — Telehealth: Payer: Self-pay | Admitting: Family

## 2017-07-09 NOTE — Telephone Encounter (Signed)
Please mail letter-   Hope you are well.   In reviewing your chart, it appears your are due for annual mammogram.  Please let us know if you would like for me to order. You may call the office to let us know, and we will order for you.   Once the order in the system, you may schedule at your preferred location.   Typically women have been using one of the sites below however you may go where you have been in the past. I would ensure that when you do get a mammogram that it is 3D ( as opposed to 2D which was prior technology). Evidence suggests that 3D is superior.   Please note that NOT all insurance companies cover 3D, and you may have to pay a higher copay. You may call your insurance company to further clarify your benefits.   Options for Mammogram in [place]:    [facility]  [address]  [place], [place]  [phone]   [facility] [address] [place], [place] [phone]   Let us know if you have questions.   My best,   Deray Dawes, NP   

## 2017-07-18 NOTE — Telephone Encounter (Signed)
Letter printed and mailed.  

## 2017-08-01 ENCOUNTER — Ambulatory Visit: Payer: Self-pay | Admitting: Internal Medicine

## 2017-08-02 ENCOUNTER — Encounter: Payer: Self-pay | Admitting: Internal Medicine

## 2017-08-02 ENCOUNTER — Ambulatory Visit (INDEPENDENT_AMBULATORY_CARE_PROVIDER_SITE_OTHER): Payer: Medicare Other | Admitting: Internal Medicine

## 2017-08-02 VITALS — BP 147/57 | HR 88 | Resp 16 | Ht 65.0 in | Wt 124.6 lb

## 2017-08-02 DIAGNOSIS — E782 Mixed hyperlipidemia: Secondary | ICD-10-CM | POA: Diagnosis not present

## 2017-08-02 DIAGNOSIS — Z0001 Encounter for general adult medical examination with abnormal findings: Secondary | ICD-10-CM | POA: Diagnosis not present

## 2017-08-02 DIAGNOSIS — M2550 Pain in unspecified joint: Secondary | ICD-10-CM | POA: Diagnosis not present

## 2017-08-02 DIAGNOSIS — R011 Cardiac murmur, unspecified: Secondary | ICD-10-CM | POA: Diagnosis not present

## 2017-08-02 DIAGNOSIS — I10 Essential (primary) hypertension: Secondary | ICD-10-CM | POA: Diagnosis not present

## 2017-08-02 NOTE — Progress Notes (Signed)
[facility] [place], [place] [address]  Internal MEDICINE  Office Visit Note  Patient Name: Maria Gonzalez  [id number]  [id number]  Date of Service: 08/15/2017   Complaints/HPI Pt is here for establishment of PCP. She denies any major complaints at this time, she will need CPE and mammogram.  Hypertension  This is a chronic problem. The current episode started more than 1 year ago. The problem has been resolved since onset. The problem is controlled. Pertinent negatives include no chest pain, neck pain, palpitations or shortness of breath. Risk factors for coronary artery disease include post-menopausal state and dyslipidemia.    Current Medication: Outpatient Encounter Medications as of 08/02/2017  Medication Sig Note  . acetaminophen (TYLENOL) 325 MG tablet Take 325 mg by mouth. 03/21/2016: Received from: [place]: Take 325 mg by mouth. Frequency:PRN   Dosage:325   MG  Instructions:  Note:Dose: 325MG   . Calcium Carbonate-Vit D-Min (CALCIUM 1200 PO) Take by mouth.   . co-enzyme Q-10 30 MG capsule Take 30 mg by mouth 3 (three) times daily.   . Cyanocobalamin (VITAMIN B-12 CR) 1000 MCG TBCR Take by mouth. 03/21/2016: Received from: [place]: Take by mouth. Frequency:QD   Dosage:1000   MCG  Instructions:  Note:Dose: 1000MCG  . Krill Oil 300 MG CAPS Take by mouth.   . Magnesium 250 MG TABS Frequency:PRN   Dosage:0.0     Instructions:  Note:Dose: 1-2 TABS 03/21/2016: Received from: [facility]  . metoprolol succinate (TOPROL-XL) 25 MG 24 hr tablet Take 12.5 mg by mouth daily.   . Multiple Vitamin (MULTIVITAMIN) capsule Take 1 capsule by mouth daily.   . Omega-3 Fatty Acids (FISH OIL) 1000 MG CAPS Take by mouth. 03/21/2016: Received from: [place]: Take by mouth. Frequency:QD   Dosage:0.0     Instructions:  Note:Dose: 800-200MG   . vitamin E 400 UNIT capsule Take 400 Units by mouth daily.   . [DISCONTINUED]  Calcium Carbonate-Vitamin D 600-400 MG-UNIT tablet Take by mouth. 03/21/2016: Received from: [place]: Take by mouth. Frequency:QD   Dosage:0.0     Instructions:  Note:Dose: 751 MG-200  . [DISCONTINUED] aspirin EC 81 MG tablet Take 81 mg by mouth daily.   . [DISCONTINUED] metoprolol succinate (TOPROL-XL) 25 MG 24 hr tablet Take 1 tablet (25 mg total) by mouth daily. (Patient not taking: Reported on 08/02/2017)    No facility-administered encounter medications on file as of 08/02/2017.     Surgical History: Past Surgical History:  Procedure Laterality Date  . calcification removal    . right breast biopsy  2013   right breast calcification s/p radiation  . TONSILLECTOMY AND ADENOIDECTOMY      Medical History: Past Medical History:  Diagnosis Date  . Allergy   . Heart murmur   . History of chicken pox   . Hyperlipidemia   . Hypertension   . UTI (urinary tract infection)     Family History: Family History  Problem Relation Age of Onset  . Hypertension Mother   . Stroke Mother   . Heart disease Father   . Cervical cancer Maternal Grandmother 60  . Stroke Maternal Grandfather   . Colon cancer Neg Hx     Social History   Socioeconomic History  . Marital status: Married    Spouse name: Not on file  . Number of children: Not on file  . Years of education: Not on file  .  Highest education level: Not on file  Occupational History  . Not on file  Social Needs  . Financial resource strain: Not on file  . Food insecurity:    Worry: Not on file    Inability: Not on file  . Transportation needs:    Medical: Not on file    Non-medical: Not on file  Tobacco Use  . Smoking status: Never Smoker  . Smokeless tobacco: Never Used  Substance and Sexual Activity  . Alcohol use: No  . Drug use: No  . Sexual activity: Not on file  Lifestyle  . Physical activity:    Days per week: Not on file    Minutes per session: Not on file  . Stress: Not on file   Relationships  . Social connections:    Talks on phone: Not on file    Gets together: Not on file    Attends religious service: Not on file    Active member of club or organization: Not on file    Attends meetings of clubs or organizations: Not on file    Relationship status: Not on file  . Intimate partner violence:    Fear of current or ex partner: Not on file    Emotionally abused: Not on file    Physically abused: Not on file    Forced sexual activity: Not on file  Other Topics Concern  . Not on file  Social History Narrative   Lives in [place]   Married.   Cats.      Enjoys shopping, [place], [place].      Retired [employer] for [employer].      Horticulture degree.      Review of Systems  Constitutional: Negative for chills, fatigue and unexpected weight change.  HENT: Negative for congestion, postnasal drip, rhinorrhea, sneezing and sore throat.   Eyes: Negative for redness.  Respiratory: Negative for cough, chest tightness and shortness of breath.   Cardiovascular: Negative for chest pain and palpitations.  Gastrointestinal: Negative for abdominal pain, constipation, diarrhea, nausea and vomiting.  Genitourinary: Negative for dysuria and frequency.  Musculoskeletal: Negative for arthralgias, back pain, joint swelling and neck pain.  Skin: Negative for rash.  Neurological: Negative.  Negative for tremors and numbness.  Hematological: Negative for adenopathy. Does not bruise/bleed easily.  Psychiatric/Behavioral: Negative for behavioral problems (Depression), sleep disturbance and suicidal ideas. The patient is not nervous/anxious.     Vital Signs: BP (!) 147/57 (BP Location: Left Arm, Patient Position: Sitting, Cuff Size: Normal)   Pulse 88   Resp 16   Ht 5\' 5"  (1.651 m)   Wt 124 lb 9.6 oz (56.5 kg)   SpO2 99%   BMI 20.73 kg/m    Physical Exam  Constitutional: She is oriented to person, place, and time. She appears well-developed and  well-nourished. No distress.  HENT:  Head: Normocephalic and atraumatic.  Mouth/Throat: Oropharynx is clear and moist. No oropharyngeal exudate.  Eyes: Pupils are equal, round, and reactive to light. EOM are normal.  Neck: Normal range of motion. Neck supple. No JVD present. No tracheal deviation present. No thyromegaly present.  Cardiovascular: Normal rate and regular rhythm. Exam reveals no gallop and no friction rub.  Murmur heard. Pulmonary/Chest: Effort normal. No respiratory distress. She has no wheezes. She has no rales. She exhibits no tenderness.  Abdominal: Soft. Bowel sounds are normal.  Musculoskeletal: Normal range of motion.  Lymphadenopathy:    She has no cervical adenopathy.  Neurological: She is alert and  oriented to person, place, and time. No cranial nerve deficit.  Skin: Skin is warm and dry. She is not diaphoretic.  Psychiatric: She has a normal mood and affect. Her behavior is normal. Judgment and thought content normal.   Assessment/Plan: 1. Mixed hyperlipidemia Ordered for next visit.  - Lipid Panel With LDL/HDL Ratio  2. Murmur, cardiac Ordered  - C-reactive protein - ECHOCARDIOGRAM LIMITED; Future  3. Essential hypertension, malignant Continue - metoprolol succinate (TOPROL-XL) 25 MG 24 hr tablet; Take 12.5 mg by mouth daily. - Comprehensive metabolic panel  4. Arthralgia, unspecified joint Continue - OTC tylenol as before   General Counseling: Hana verbalizes understanding of the findings of todays visit and agrees with plan of treatment. I have discussed any further diagnostic evaluation that may be needed or ordered today. We also reviewed her medications today. she has been encouraged to call the office with any questions or concerns that should arise related to todays visit. Cardiac risk factor modification:  1. Control blood pressure. 2. Exercise as prescribed. 3. Follow low sodium, low fat diet. and low fat and low cholestrol diet. 4. Take  ASA 81mg  once a day.   Orders Placed This Encounter  Procedures  . CBC with Differential/Platelet  . Lipid Panel With LDL/HDL Ratio  . TSH  . T4, free  . Comprehensive metabolic panel  . C-reactive protein  . ECHOCARDIOGRAM LIMITED     Time spent:25 Minutes

## 2017-08-11 ENCOUNTER — Ambulatory Visit: Payer: Medicare Other

## 2017-08-11 DIAGNOSIS — R011 Cardiac murmur, unspecified: Secondary | ICD-10-CM | POA: Diagnosis not present

## 2017-08-28 DIAGNOSIS — Z0001 Encounter for general adult medical examination with abnormal findings: Secondary | ICD-10-CM | POA: Diagnosis not present

## 2017-08-28 DIAGNOSIS — E782 Mixed hyperlipidemia: Secondary | ICD-10-CM | POA: Diagnosis not present

## 2017-08-29 LAB — C-REACTIVE PROTEIN

## 2017-08-29 LAB — CBC WITH DIFFERENTIAL/PLATELET
Basophils Absolute: 0 10*3/uL (ref 0.0–0.2)
Basos: 1 %
EOS (ABSOLUTE): 0.1 10*3/uL (ref 0.0–0.4)
EOS: 2 %
HEMATOCRIT: 43.6 % (ref 34.0–46.6)
HEMOGLOBIN: 14.7 g/dL (ref 11.1–15.9)
IMMATURE GRANS (ABS): 0 10*3/uL (ref 0.0–0.1)
IMMATURE GRANULOCYTES: 0 %
Lymphocytes Absolute: 1 10*3/uL (ref 0.7–3.1)
Lymphs: 17 %
MCH: 31.5 pg (ref 26.6–33.0)
MCHC: 33.7 g/dL (ref 31.5–35.7)
MCV: 94 fL (ref 79–97)
MONOCYTES: 7 %
MONOS ABS: 0.4 10*3/uL (ref 0.1–0.9)
NEUTROS PCT: 73 %
Neutrophils Absolute: 4.5 10*3/uL (ref 1.4–7.0)
Platelets: 240 10*3/uL (ref 150–379)
RBC: 4.66 x10E6/uL (ref 3.77–5.28)
RDW: 14.2 % (ref 12.3–15.4)
WBC: 6 10*3/uL (ref 3.4–10.8)

## 2017-08-29 LAB — COMPREHENSIVE METABOLIC PANEL
ALBUMIN: 4.8 g/dL (ref 3.5–4.8)
ALK PHOS: 67 IU/L (ref 39–117)
ALT: 18 IU/L (ref 0–32)
AST: 27 IU/L (ref 0–40)
Albumin/Globulin Ratio: 2.5 — ABNORMAL HIGH (ref 1.2–2.2)
BILIRUBIN TOTAL: 0.5 mg/dL (ref 0.0–1.2)
BUN / CREAT RATIO: 18 (ref 12–28)
BUN: 12 mg/dL (ref 8–27)
CHLORIDE: 102 mmol/L (ref 96–106)
CO2: 23 mmol/L (ref 20–29)
Calcium: 9.6 mg/dL (ref 8.7–10.3)
Creatinine, Ser: 0.68 mg/dL (ref 0.57–1.00)
GFR calc non Af Amer: 89 mL/min/{1.73_m2} (ref >59)
GFR, EST AFRICAN AMERICAN: 102 mL/min/{1.73_m2} (ref >59)
GLOBULIN, TOTAL: 1.9 g/dL (ref 1.5–4.5)
GLUCOSE: 84 mg/dL (ref 65–99)
Potassium: 3.9 mmol/L (ref 3.5–5.2)
SODIUM: 143 mmol/L (ref 134–144)
TOTAL PROTEIN: 6.7 g/dL (ref 6.0–8.5)

## 2017-08-29 LAB — LIPID PANEL WITH LDL/HDL RATIO
Cholesterol, Total: 260 mg/dL — ABNORMAL HIGH (ref 100–199)
HDL: 56 mg/dL (ref >39)
LDL CALC: 185 mg/dL — AB (ref 0–99)
LDL/HDL RATIO: 3.3 ratio — AB (ref 0.0–3.2)
TRIGLYCERIDES: 93 mg/dL (ref 0–149)
VLDL CHOLESTEROL CAL: 19 mg/dL (ref 5–40)

## 2017-08-29 LAB — TSH: TSH: 2.24 u[IU]/mL (ref 0.450–4.500)

## 2017-08-29 LAB — T4, FREE: Free T4: 1.28 ng/dL (ref 0.82–1.77)

## 2017-09-05 ENCOUNTER — Ambulatory Visit: Payer: Medicare Other | Admitting: Internal Medicine

## 2017-09-05 ENCOUNTER — Encounter: Payer: Self-pay | Admitting: Internal Medicine

## 2017-09-05 VITALS — BP 140/70 | HR 77 | Resp 16 | Ht 65.0 in | Wt 123.8 lb

## 2017-09-05 DIAGNOSIS — E782 Mixed hyperlipidemia: Secondary | ICD-10-CM | POA: Diagnosis not present

## 2017-09-05 DIAGNOSIS — R072 Precordial pain: Secondary | ICD-10-CM

## 2017-09-05 DIAGNOSIS — I341 Nonrheumatic mitral (valve) prolapse: Secondary | ICD-10-CM | POA: Diagnosis not present

## 2017-09-05 NOTE — Progress Notes (Signed)
[facility] [place], [place] [address]  Internal MEDICINE  Office Visit Note  Patient Name: Florie Carico  [id number]  [id number]  Date of Service: 09/05/2017  Chief Complaint  Patient presents with  . Hyperlipidemia    labs     HPI  Pt is here for routine follow up. All labs have been normal except abnormal lipid profile, she feels reluctant about therapy, Home BP readings are within normal range  S/P radiation to right breast 2013, has dense breast and gets diagnostic mammogram   Current Medication: Outpatient Encounter Medications as of 09/05/2017  Medication Sig Note  . acetaminophen (TYLENOL) 325 MG tablet Take 325 mg by mouth. 03/21/2016: Received from: [place]: Take 325 mg by mouth. Frequency:PRN   Dosage:325   MG  Instructions:  Note:Dose: 325MG   . Calcium Carbonate-Vit D-Min (CALCIUM 1200 PO) Take by mouth.   . co-enzyme Q-10 30 MG capsule Take 30 mg by mouth 3 (three) times daily.   . Cyanocobalamin (VITAMIN B-12 CR) 1000 MCG TBCR Take by mouth. 03/21/2016: Received from: [place]: Take by mouth. Frequency:QD   Dosage:1000   MCG  Instructions:  Note:Dose: 1000MCG  . Krill Oil 300 MG CAPS Take by mouth.   . Magnesium 250 MG TABS Frequency:PRN   Dosage:0.0     Instructions:  Note:Dose: 1-2 TABS 03/21/2016: Received from: [facility]  . metoprolol succinate (TOPROL-XL) 25 MG 24 hr tablet Take 12.5 mg by mouth daily.   . Multiple Vitamin (MULTIVITAMIN) capsule Take 1 capsule by mouth daily.   . Omega-3 Fatty Acids (FISH OIL) 1000 MG CAPS Take by mouth. 03/21/2016: Received from: [place]: Take by mouth. Frequency:QD   Dosage:0.0     Instructions:  Note:Dose: 800-200MG   . vitamin E 400 UNIT capsule Take 400 Units by mouth daily.    No facility-administered encounter medications on file as of 09/05/2017.     Surgical History: Past Surgical History:  Procedure Laterality Date  .  calcification removal    . right breast biopsy  2013   right breast calcification s/p radiation  . TONSILLECTOMY AND ADENOIDECTOMY      Medical History: Past Medical History:  Diagnosis Date  . Allergy   . Heart murmur   . History of chicken pox   . Hyperlipidemia   . Hypertension   . UTI (urinary tract infection)     Family History: Family History  Problem Relation Age of Onset  . Hypertension Mother   . Stroke Mother   . Heart disease Father   . Cervical cancer Maternal Grandmother 60  . Stroke Maternal Grandfather   . Colon cancer Neg Hx     Social History   Socioeconomic History  . Marital status: Married    Spouse name: Not on file  . Number of children: Not on file  . Years of education: Not on file  . Highest education level: Not on file  Occupational History  . Not on file  Social Needs  . Financial resource strain: Not on file  . Food insecurity:    Worry: Not on file    Inability: Not on file  . Transportation needs:    Medical: Not on file    Non-medical: Not on file  Tobacco Use  . Smoking status: Never Smoker  . Smokeless tobacco: Never Used  Substance and Sexual Activity  . Alcohol use: No  . Drug use: No  .  Sexual activity: Not on file  Lifestyle  . Physical activity:    Days per week: Not on file    Minutes per session: Not on file  . Stress: Not on file  Relationships  . Social connections:    Talks on phone: Not on file    Gets together: Not on file    Attends religious service: Not on file    Active member of club or organization: Not on file    Attends meetings of clubs or organizations: Not on file    Relationship status: Not on file  . Intimate partner violence:    Fear of current or ex partner: Not on file    Emotionally abused: Not on file    Physically abused: Not on file    Forced sexual activity: Not on file  Other Topics Concern  . Not on file  Social History Narrative   Lives in [place]   Married.   Cats.       Enjoys shopping, [place], [place].      Retired [employer] for [employer].      Horticulture degree.    Review of Systems  Constitutional: Negative.   HENT: Negative.   Eyes: Negative.   Respiratory: Negative.   Gastrointestinal: Negative.    Vital Signs: BP 140/70 (BP Location: Right Arm, Patient Position: Sitting, Cuff Size: Large)   Pulse 77   Resp 16   Ht 5\' 5"  (1.651 m)   Wt 123 lb 12.8 oz (56.2 kg)   SpO2 99%   BMI 20.60 kg/m   Physical Exam  Constitutional: She appears well-developed and well-nourished.  HENT:  Head: Normocephalic and atraumatic.  Eyes: Pupils are equal, round, and reactive to light. EOM are normal.  Neck: Normal range of motion. Neck supple.  Cardiovascular: Normal rate and regular rhythm.   Assessment/Plan: 1. Mixed hyperlipidemia - Diet controlled, she will like to think about CT cardiac score  2. Mitral valve prolapse - Controlled   General Counseling: Gabreille verbalizes understanding of the findings of todays visit and agrees with plan of treatment. I have discussed any further diagnostic evaluation that may be needed or ordered today. We also reviewed her medications today. she has been encouraged to call the office with any questions or concerns that should arise related to todays visit.   Orders Placed This Encounter  Procedures  . CT CARDIAC SCORING    Time spent:20 Minutes   Dr Lavera Guise Internal medicine

## 2017-09-06 ENCOUNTER — Ambulatory Visit: Payer: Self-pay | Admitting: Internal Medicine

## 2017-09-25 ENCOUNTER — Other Ambulatory Visit: Payer: Self-pay

## 2017-09-25 DIAGNOSIS — E785 Hyperlipidemia, unspecified: Secondary | ICD-10-CM

## 2017-09-25 NOTE — Progress Notes (Signed)
Dr. Laurelyn Sickle patient with [facility]

## 2017-10-12 ENCOUNTER — Ambulatory Visit (INDEPENDENT_AMBULATORY_CARE_PROVIDER_SITE_OTHER)
Admission: RE | Admit: 2017-10-12 | Discharge: 2017-10-12 | Disposition: A | Discharge status: Home / Self Care | Payer: Medicare Other | Source: Ambulatory Visit | Attending: Cardiovascular Disease | Admitting: Cardiovascular Disease

## 2017-10-12 DIAGNOSIS — E785 Hyperlipidemia, unspecified: Secondary | ICD-10-CM

## 2017-10-23 ENCOUNTER — Telehealth: Payer: Self-pay | Admitting: Internal Medicine

## 2017-10-23 NOTE — Telephone Encounter (Signed)
Visit was scheduled.

## 2017-11-08 DIAGNOSIS — S80811A Abrasion, right lower leg, initial encounter: Secondary | ICD-10-CM | POA: Diagnosis not present

## 2017-11-08 DIAGNOSIS — Z23 Encounter for immunization: Secondary | ICD-10-CM | POA: Diagnosis not present

## 2017-11-14 ENCOUNTER — Ambulatory Visit (INDEPENDENT_AMBULATORY_CARE_PROVIDER_SITE_OTHER): Payer: Medicare Other | Admitting: Internal Medicine

## 2017-11-14 ENCOUNTER — Other Ambulatory Visit: Payer: Self-pay

## 2017-11-14 ENCOUNTER — Encounter: Payer: Self-pay | Admitting: Internal Medicine

## 2017-11-14 ENCOUNTER — Encounter (INDEPENDENT_AMBULATORY_CARE_PROVIDER_SITE_OTHER): Payer: Self-pay

## 2017-11-14 VITALS — BP 142/80 | HR 87 | Resp 16 | Ht 65.0 in | Wt 119.0 lb

## 2017-11-14 DIAGNOSIS — E782 Mixed hyperlipidemia: Secondary | ICD-10-CM | POA: Diagnosis not present

## 2017-11-14 DIAGNOSIS — R931 Abnormal findings on diagnostic imaging of heart and coronary circulation: Secondary | ICD-10-CM

## 2017-11-14 MED ORDER — PNEUMOCOCCAL VAC POLYVALENT 25 MCG/0.5ML IJ INJ: 0.5000 mL | INJECTION | INTRAMUSCULAR | 0 refills | Status: AC

## 2017-11-14 NOTE — Progress Notes (Signed)
[facility] [place], [place] [address]  Internal MEDICINE  Office Visit Note  Patient Name: Maria Gonzalez  [id number]  [id number]  Date of Service: 11/23/2017  Chief Complaint  Patient presents with  . Results    CT cardiac Scoring   . Hyperlipidemia    HPI  Pt is here for routine follow up. She is here to discuss results of ccscore. It is abnormal. Pt also has atherosclerosis on CT. Abnormal lipid profile  Current Medication: Outpatient Encounter Medications as of 11/14/2017  Medication Sig Note  . acetaminophen (TYLENOL) 325 MG tablet Take 325 mg by mouth. 03/21/2016: Received from: [place]: Take 325 mg by mouth. Frequency:PRN   Dosage:325   MG  Instructions:  Note:Dose: 325MG   . Calcium Carbonate-Vit D-Min (CALCIUM 1200 PO) Take by mouth.   . Cyanocobalamin (VITAMIN B-12 CR) 1000 MCG TBCR Take by mouth. 03/21/2016: Received from: [place]: Take by mouth. Frequency:QD   Dosage:1000   MCG  Instructions:  Note:Dose: 1000MCG  . Magnesium 250 MG TABS Frequency:PRN   Dosage:0.0     Instructions:  Note:Dose: 1-2 TABS 03/21/2016: Received from: [facility]  . Multiple Vitamin (MULTIVITAMIN) capsule Take 1 capsule by mouth daily.   . Niacin (VITAMIN B-3 PO) Take by mouth.   . vitamin E 400 UNIT capsule Take 400 Units by mouth daily.   . [DISCONTINUED] pneumococcal 23 valent vaccine (PNEUMOVAX 23) 25 MCG/0.5ML injection Inject 0.5 mLs into the muscle tomorrow at 10 am.   . co-enzyme Q-10 30 MG capsule Take 30 mg by mouth 3 (three) times daily.   Maria Gonzalez Oil 300 MG CAPS Take by mouth.   . metoprolol succinate (TOPROL-XL) 25 MG 24 hr tablet Take 12.5 mg by mouth daily.   . Omega-3 Fatty Acids (FISH OIL) 1000 MG CAPS Take by mouth. 03/21/2016: Received from: [place]: Take by mouth. Frequency:QD   Dosage:0.0     Instructions:  Note:Dose: 800-200MG    No facility-administered encounter medications on file as  of 11/14/2017.     Surgical History: Past Surgical History:  Procedure Laterality Date  . calcification removal    . right breast biopsy  2013   right breast calcification s/p radiation  . TONSILLECTOMY AND ADENOIDECTOMY      Medical History: Past Medical History:  Diagnosis Date  . Allergy   . Heart murmur   . History of chicken pox   . Hyperlipidemia   . Hypertension   . UTI (urinary tract infection)     Family History: Family History  Problem Relation Age of Onset  . Hypertension Mother   . Stroke Mother   . Heart disease Father   . Cervical cancer Maternal Grandmother 60  . Stroke Maternal Grandfather   . Colon cancer Neg Hx     Social History   Socioeconomic History  . Marital status: Married    Spouse name: Not on file  . Number of children: Not on file  . Years of education: Not on file  . Highest education level: Not on file  Occupational History  . Not on file  Social Needs  . Financial resource strain: Not on file  . Food insecurity:    Worry: Not on file    Inability: Not on file  . Transportation needs:    Medical: Not on file    Non-medical: Not on file  Tobacco Use  . Smoking status: Never Smoker  .  Smokeless tobacco: Never Used  Substance and Sexual Activity  . Alcohol use: No  . Drug use: No  . Sexual activity: Not on file  Lifestyle  . Physical activity:    Days per week: Not on file    Minutes per session: Not on file  . Stress: Not on file  Relationships  . Social connections:    Talks on phone: Not on file    Gets together: Not on file    Attends religious service: Not on file    Active member of club or organization: Not on file    Attends meetings of clubs or organizations: Not on file    Relationship status: Not on file  . Intimate partner violence:    Fear of current or ex partner: Not on file    Emotionally abused: Not on file    Physically abused: Not on file    Forced sexual activity: Not on file  Other Topics  Concern  . Not on file  Social History Narrative   Lives in [place]   Married.   Cats.      Enjoys shopping, [place], [place].      Retired [employer] for [employer].      Horticulture degree.       Review of Systems  Constitutional: Negative for chills, diaphoresis and fatigue.  HENT: Negative for ear pain, postnasal drip and sinus pressure.   Eyes: Negative for photophobia, discharge, redness, itching and visual disturbance.  Respiratory: Negative for cough, shortness of breath and wheezing.   Cardiovascular: Negative for chest pain, palpitations and leg swelling.  Gastrointestinal: Negative for abdominal pain, constipation, diarrhea, nausea and vomiting.  Genitourinary: Negative for dysuria and flank pain.  Musculoskeletal: Negative for arthralgias, back pain, gait problem and neck pain.  Skin: Negative for color change.  Allergic/Immunologic: Negative for environmental allergies and food allergies.  Neurological: Negative for dizziness and headaches.  Hematological: Does not bruise/bleed easily.  Psychiatric/Behavioral: Negative for agitation, behavioral problems (depression) and hallucinations.    Vital Signs: BP (!) 142/80 (BP Location: Left Arm, Patient Position: Sitting, Cuff Size: Normal)   Pulse 87   Resp 16   Ht 5\' 5"  (1.651 m)   Wt 119 lb (54 kg)   SpO2 97%   BMI 19.80 kg/m    Physical Exam  Constitutional: She appears well-developed and well-nourished.  HENT:  Head: Normocephalic and atraumatic.  Eyes: Pupils are equal, round, and reactive to light. EOM are normal.  Cardiovascular: Normal rate and regular rhythm.  Pulmonary/Chest: Effort normal and breath sounds normal.   Assessment/Plan: 1. Mixed hyperlipidemia - Start Crestor 10 mg po qd - Niacin (VITAMIN B-3 PO); Take by mouth.  2. Abnormal screening cardiac CT - Further diagnostics  - Ambulatory referral to Cardiology  General Counseling: [name] verbalizes understanding of the findings  of todays visit and agrees with plan of treatment. I have discussed any further diagnostic evaluation that may be needed or ordered today. We also reviewed her medications today. she has been encouraged to call the office with any questions or concerns that should arise related to todays visit. Cardiac risk factor modification:  1. Control blood pressure. 2. Exercise as prescribed. 3. Follow low sodium, low fat diet. and low fat and low cholestrol diet. 4. Take ASA 81mg  once a day. 5. Restricted calories diet to lose weight.    Orders Placed This Encounter  Procedures  . Ambulatory referral to Cardiology     Time spent: 25 Minutes  Dr Lavera Guise Internal medicine

## 2017-11-21 ENCOUNTER — Telehealth: Payer: Self-pay

## 2017-11-21 ENCOUNTER — Other Ambulatory Visit: Payer: Self-pay | Admitting: Internal Medicine

## 2017-11-21 DIAGNOSIS — E782 Mixed hyperlipidemia: Secondary | ICD-10-CM

## 2017-11-21 DIAGNOSIS — Z23 Encounter for immunization: Secondary | ICD-10-CM | POA: Diagnosis not present

## 2017-11-21 MED ORDER — ROSUVASTATIN CALCIUM 10 MG PO TABS: 10.0000 mg | ORAL_TABLET | Freq: Every day | ORAL | 3 refills | Status: DC

## 2017-11-21 NOTE — Telephone Encounter (Signed)
PT WAS ALSO NOTIFIED.

## 2017-11-21 NOTE — Telephone Encounter (Signed)
CALLED IN MEDICATION TO [facility] LISTED IN HER CHART.

## 2017-11-21 NOTE — Telephone Encounter (Signed)
CALLED IN CRESTOR 10 MG PO QD #90 3 REFILLS PER [facility].

## 2017-12-18 DIAGNOSIS — Z13220 Encounter for screening for lipoid disorders: Secondary | ICD-10-CM | POA: Diagnosis not present

## 2018-01-16 ENCOUNTER — Ambulatory Visit: Payer: Medicare Other | Admitting: Cardiovascular Disease

## 2018-01-31 DIAGNOSIS — Z23 Encounter for immunization: Secondary | ICD-10-CM | POA: Diagnosis not present

## 2018-02-26 ENCOUNTER — Telehealth: Payer: Self-pay

## 2018-02-26 NOTE — Telephone Encounter (Signed)
Pt advised ll for labs order its already in the system for future order since 7/19

## 2018-02-28 ENCOUNTER — Other Ambulatory Visit: Payer: Self-pay | Admitting: Internal Medicine

## 2018-02-28 DIAGNOSIS — E782 Mixed hyperlipidemia: Secondary | ICD-10-CM | POA: Diagnosis not present

## 2018-03-01 LAB — HEPATIC FUNCTION PANEL
ALBUMIN: 4.9 g/dL — AB (ref 3.5–4.8)
ALK PHOS: 76 IU/L (ref 39–117)
ALT: 24 IU/L (ref 0–32)
AST: 32 IU/L (ref 0–40)
Bilirubin Total: 0.5 mg/dL (ref 0.0–1.2)
Bilirubin, Direct: 0.14 mg/dL (ref 0.00–0.40)
TOTAL PROTEIN: 6.6 g/dL (ref 6.0–8.5)

## 2018-03-01 LAB — LIPID PANEL WITH LDL/HDL RATIO
Cholesterol, Total: 150 mg/dL (ref 100–199)
HDL: 54 mg/dL (ref >39)
LDL Calculated: 81 mg/dL (ref 0–99)
LDL/HDL RATIO: 1.5 ratio (ref 0.0–3.2)
Triglycerides: 76 mg/dL (ref 0–149)
VLDL CHOLESTEROL CAL: 15 mg/dL (ref 5–40)

## 2018-03-07 ENCOUNTER — Telehealth: Payer: Self-pay

## 2018-03-08 NOTE — Telephone Encounter (Signed)
Pt advised for labs is improved discuss in detail on next visit

## 2018-03-15 ENCOUNTER — Ambulatory Visit (INDEPENDENT_AMBULATORY_CARE_PROVIDER_SITE_OTHER): Payer: Medicare Other | Admitting: Cardiovascular Disease

## 2018-03-15 ENCOUNTER — Encounter

## 2018-03-15 ENCOUNTER — Encounter: Payer: Self-pay | Admitting: Cardiovascular Disease

## 2018-03-15 VITALS — BP 154/74 | HR 78 | Ht 65.0 in | Wt 121.8 lb

## 2018-03-15 DIAGNOSIS — I341 Nonrheumatic mitral (valve) prolapse: Secondary | ICD-10-CM | POA: Diagnosis not present

## 2018-03-15 DIAGNOSIS — I2510 Atherosclerotic heart disease of native coronary artery without angina pectoris: Secondary | ICD-10-CM

## 2018-03-15 DIAGNOSIS — E785 Hyperlipidemia, unspecified: Secondary | ICD-10-CM | POA: Diagnosis not present

## 2018-03-15 NOTE — Progress Notes (Addendum)
Cardiology Office Note   Date:  03/15/2018   ID:  Maria Gonzalez, DOB [birth year], MRN [id number]  PCP:  Lavera Guise, MD  Cardiologist:   Kathlyn Sacramento, MD   Chief Complaint  Patient presents with  . New Patient (Initial Visit)    HTN, Hyperlipidemia, Medications reviewed verbally.      History of Present Illness: Maria Gonzalez is a 72 y.o. female who was referred by Dr. Clayborn Bigness for evaluation of abnormal CT calcium score and mitral valve prolapse.  The patient reports prolonged history of a cardiac murmur due to mitral valve prolapse.  She also has known history of hyperlipidemia and whitecoat syndrome.  She is not diabetic.  There is no history of smoking.  Family history is remarkable for coronary artery disease.  Her father had massive myocardial infarction at the age of 69.  Her mother had CABG in her early 83s. She had an echocardiogram done in May of this year which showed normal LV systolic function with moderate mitral valve prolapse and moderate regurgitation. She had CT calcium scoring in June of this year which was abnormal at 183.  She had severe hyperlipidemia and was started on rosuvastatin 10 mg daily.  She also started taking over-the-counter niacin and has improved her diet. She has been doing well with no recent chest pain, shortness of breath or palpitations.  She walks regularly for exercise.   Past Medical History:  Diagnosis Date  . Allergy   . Heart murmur   . History of chicken pox   . Hyperlipidemia   . Hypertension   . Mitral valve prolapse   . UTI (urinary tract infection)     Past Surgical History:  Procedure Laterality Date  . calcification removal    . right breast biopsy  2013   right breast calcification s/p radiation  . TONSILLECTOMY AND ADENOIDECTOMY       Current Outpatient Medications  Medication Sig Dispense Refill  . acetaminophen (TYLENOL) 325 MG tablet Take 325 mg by mouth.    . Calcium Carbonate-Vit D-Min (CALCIUM 1200 PO)  Take by mouth.    . Cyanocobalamin (VITAMIN B-12 CR) 1000 MCG TBCR Take by mouth.    Javier Docker Oil 300 MG CAPS Take by mouth.    . Magnesium 250 MG TABS Frequency:PRN   Dosage:0.0     Instructions:  Note:Dose: 1-2 TABS    . Multiple Vitamin (MULTIVITAMIN) capsule Take 1 capsule by mouth daily.    . Niacin (VITAMIN B-3 PO) Take by mouth.    . Omega-3 Fatty Acids (FISH OIL) 1000 MG CAPS Take by mouth.    . rosuvastatin (CRESTOR) 10 MG tablet Take 1 tablet (10 mg total) by mouth daily. 90 tablet 3  . vitamin E 400 UNIT capsule Take 400 Units by mouth daily.     No current facility-administered medications for this visit.     Allergies:   Penicillins; Sulfa antibiotics; and Hydrocodone-acetaminophen    Social History:  The patient  reports that she has never smoked. She has never used smokeless tobacco. She reports that she does not drink alcohol or use drugs.   Family History:  The patient's family history includes Cervical cancer (age of onset: 55) in her maternal grandmother; Heart disease in her father; Hypertension in her mother; Stroke in her maternal grandfather and mother.    ROS:  Please see the history of present illness.   Otherwise, review of systems are positive for none.   All  other systems are reviewed and negative.    PHYSICAL EXAM: VS:  BP (!) 154/74 (BP Location: Right Arm, Patient Position: Sitting, Cuff Size: Normal)   Pulse 78   Ht 5\' 5"  (1.651 m)   Wt 121 lb 12 oz (55.2 kg)   BMI 20.26 kg/m  , BMI Body mass index is 20.26 kg/m. GEN: Well nourished, well developed, in no acute distress  HEENT: normal  Neck: no JVD, carotid bruits, or masses Cardiac: RRR; no  rubs, or gallops,no edema .  3 out of 6 holosystolic murmur at the apex Respiratory:  clear to auscultation bilaterally, normal work of breathing GI: soft, nontender, nondistended, + BS MS: no deformity or atrophy  Skin: warm and dry, no rash Neuro:  Strength and sensation are intact Psych: euthymic mood,  full affect   EKG:  EKG is ordered today. The ekg ordered today demonstrates normal sinus rhythm with LVH and repolarization abnormalities.  He does have some PVCs.   Recent Labs: 08/28/2017: BUN 12; Creatinine, Ser 0.68; Hemoglobin 14.7; Platelets 240; Potassium 3.9; Sodium 143; TSH 2.240 02/28/2018: ALT 24    Lipid Panel    Component Value Date/Time   CHOL 150 02/28/2018 0920   TRIG 76 02/28/2018 0920   HDL 54 02/28/2018 0920   CHOLHDL 4 06/02/2016 1443   VLDL 17.8 06/02/2016 1443   LDLCALC 81 02/28/2018 0920      Wt Readings from Last 3 Encounters:  03/15/18 121 lb 12 oz (55.2 kg)  11/14/17 119 lb (54 kg)  09/05/17 123 lb 12.8 oz (56.2 kg)       No flowsheet data found.    ASSESSMENT AND PLAN:  1.  Coronary atherosclerosis without angina: I discussed with her the natural history and management of atherosclerosis.  She is currently completely asymptomatic.  Thus, no indication for stress testing especially that her calcium score was below 400.  I advised her to continue with aspirin 81 mg once daily and to continue working on her risk factors.  2.  Mitral valve prolapse: This is associated with moderate regurgitation on most recent echocardiogram in April 2019.  I plan on repeat echocardiogram next year and if regurgitation remains stable, repeat testing every 2 to 3 years.  3.  Hyperlipidemia: The patient's lipid profile 6 months ago showed a total cholesterol 260 with an LDL of 85.  Since then, she was started on rosuvastatin and improved her lifestyle.  Her LDL decreased to 81 which is close to target.  I discussed with her the option of getting her LDL below 70 with increasing the dose of rosuvastatin.  However, she wants to continue working on her healthy lifestyle for now.  4.  Elevated blood pressure with history of whitecoat syndrome: The patient used to be on small dose metoprolol which was stopped after home blood pressure readings were noted to be mostly in the  normal range.  Interestingly, her EKG does show some degree of left ventricular hypertrophy.  I asked her to continue monitoring of this at home and bring me the readings with her for next visit with a low threshold to start treatment with an ACE inhibitor or ARB.    Disposition:   FU with me in 6 months  Signed,  Kathlyn Sacramento, MD  03/15/2018 12:40 PM    [place]

## 2018-03-15 NOTE — Patient Instructions (Signed)

## 2018-03-19 ENCOUNTER — Other Ambulatory Visit: Payer: Self-pay

## 2018-03-20 ENCOUNTER — Ambulatory Visit (INDEPENDENT_AMBULATORY_CARE_PROVIDER_SITE_OTHER): Payer: Medicare Other | Admitting: Internal Medicine

## 2018-03-20 ENCOUNTER — Encounter: Payer: Self-pay | Admitting: Internal Medicine

## 2018-03-20 DIAGNOSIS — E782 Mixed hyperlipidemia: Secondary | ICD-10-CM

## 2018-03-20 DIAGNOSIS — Z0001 Encounter for general adult medical examination with abnormal findings: Secondary | ICD-10-CM | POA: Diagnosis not present

## 2018-03-20 DIAGNOSIS — N6011 Diffuse cystic mastopathy of right breast: Secondary | ICD-10-CM

## 2018-03-20 DIAGNOSIS — Z1231 Encounter for screening mammogram for malignant neoplasm of breast: Secondary | ICD-10-CM | POA: Diagnosis not present

## 2018-03-20 DIAGNOSIS — N6012 Diffuse cystic mastopathy of left breast: Secondary | ICD-10-CM | POA: Diagnosis not present

## 2018-03-20 NOTE — Progress Notes (Signed)
[facility] [place], [place] [address]  Internal MEDICINE  Office Visit Note  Patient Name: Maria Gonzalez  [id number]  [id number]  Date of Service: 04/19/2018  Chief Complaint  Patient presents with  . Hypertension  . Hyperlipidemia  . Allergies  . Annual Exam   HPI Pt is here for routine health maintenance examination. Pt is doing well, no major complaints, minor allergies, taking Crestor without any problems. Pt gets diagnostics mammograms due to cysts in her breast, will be due for BMD   Current Medication: Outpatient Encounter Medications as of 03/20/2018  Medication Sig Note  . acetaminophen (TYLENOL) 325 MG tablet Take 325 mg by mouth. 03/21/2016: Received from: [place]: Take 325 mg by mouth. Frequency:PRN   Dosage:325   MG  Instructions:  Note:Dose: 325MG   . Calcium Carbonate-Vit D-Min (CALCIUM 1200 PO) Take by mouth.   . Cyanocobalamin (VITAMIN B-12 CR) 1000 MCG TBCR Take by mouth. 03/21/2016: Received from: [place]: Take by mouth. Frequency:QD   Dosage:1000   MCG  Instructions:  Note:Dose: 1000MCG  . Krill Oil 300 MG CAPS Take by mouth.   . Magnesium 250 MG TABS Frequency:PRN   Dosage:0.0     Instructions:  Note:Dose: 1-2 TABS 03/21/2016: Received from: [facility]  . Multiple Vitamin (MULTIVITAMIN) capsule Take 1 capsule by mouth daily.   . Niacin (VITAMIN B-3 PO) Take by mouth.   . Omega-3 Fatty Acids (FISH OIL) 1000 MG CAPS Take by mouth. 03/21/2016: Received from: [place]: Take by mouth. Frequency:QD   Dosage:0.0     Instructions:  Note:Dose: 800-200MG   . rosuvastatin (CRESTOR) 10 MG tablet Take 1 tablet (10 mg total) by mouth daily.   . vitamin E 400 UNIT capsule Take 400 Units by mouth daily.    No facility-administered encounter medications on file as of 03/20/2018.     Surgical History: Past Surgical History:  Procedure Laterality Date  . calcification removal    .  right breast biopsy  2013   right breast calcification s/p radiation  . TONSILLECTOMY AND ADENOIDECTOMY      Medical History: Past Medical History:  Diagnosis Date  . Allergy   . Heart murmur   . History of chicken pox   . Hyperlipidemia   . Hypertension   . Mitral valve prolapse   . UTI (urinary tract infection)     Family History: Family History  Problem Relation Age of Onset  . Hypertension Mother   . Stroke Mother   . Heart disease Father   . Cervical cancer Maternal Grandmother 60  . Stroke Maternal Grandfather   . Colon cancer Neg Hx    Review of Systems  Constitutional: Negative for chills, diaphoresis and fatigue.  HENT: Negative for ear pain, postnasal drip and sinus pressure.   Eyes: Negative for photophobia, discharge, redness, itching and visual disturbance.  Respiratory: Negative for cough, shortness of breath and wheezing.   Cardiovascular: Negative for chest pain, palpitations and leg swelling.  Gastrointestinal: Negative for abdominal pain, constipation, diarrhea, nausea and vomiting.  Genitourinary: Negative for dysuria and flank pain.  Musculoskeletal: Negative for arthralgias, back pain, gait problem and neck pain.  Skin: Negative for color change.  Allergic/Immunologic: Negative for environmental allergies and food allergies.  Neurological: Negative for dizziness and headaches.  Hematological: Does not bruise/bleed easily.  Psychiatric/Behavioral: Negative for agitation, behavioral problems (depression) and hallucinations.    Vital Signs: BP (!) 142/80 (BP Location:  Left Arm, Patient Position: Sitting, Cuff Size: Small)   Pulse 76   Resp 16   Ht 5\' 5"  (1.651 m)   Wt 118 lb (53.5 kg)   SpO2 99%   BMI 19.64 kg/m    Physical Exam Constitutional:      General: She is not in acute distress.    Appearance: She is well-developed. She is not diaphoretic.  HENT:     Head: Normocephalic and atraumatic.     Mouth/Throat:     Pharynx: No  oropharyngeal exudate.  Eyes:     Pupils: Pupils are equal, round, and reactive to light.  Neck:     Musculoskeletal: Normal range of motion and neck supple.     Thyroid: No thyromegaly.     Vascular: No JVD.     Trachea: No tracheal deviation.  Cardiovascular:     Rate and Rhythm: Normal rate and regular rhythm.     Heart sounds: Normal heart sounds. No murmur. No friction rub. No gallop.   Pulmonary:     Effort: Pulmonary effort is normal. No respiratory distress.     Breath sounds: No wheezing or rales.  Chest:     Chest wall: No mass or tenderness.     Breasts:        Right: Normal.        Left: Normal.  Abdominal:     General: Bowel sounds are normal.     Palpations: Abdomen is soft.  Musculoskeletal: Normal range of motion.  Lymphadenopathy:     Cervical: No cervical adenopathy.  Skin:    General: Skin is warm and dry.  Neurological:     Mental Status: She is alert and oriented to person, place, and time.     Cranial Nerves: No cranial nerve deficit.  Psychiatric:        Behavior: Behavior normal.        Thought Content: Thought content normal.        Judgment: Judgment normal.    LABS: Recent Results (from the past 2160 hour(s))  Lipid Panel With LDL/HDL Ratio     Status: None   Collection Time: 02/28/18  9:20 AM  Result Value Ref Range   Cholesterol, Total 150 100 - 199 mg/dL   Triglycerides 76 0 - 149 mg/dL   HDL 54 >39 mg/dL   VLDL Cholesterol Cal 15 5 - 40 mg/dL   LDL Calculated 81 0 - 99 mg/dL   LDl/HDL Ratio 1.5 0.0 - 3.2 ratio    Comment:                                     LDL/HDL Ratio                                             Men  Women                               1/2 Avg.Risk  1.0    1.5                                   Avg.Risk  3.6    3.2  2X Avg.Risk  6.2    5.0                                3X Avg.Risk  8.0    6.1   Hepatic function panel     Status: Abnormal   Collection Time: 02/28/18  9:20 AM  Result  Value Ref Range   Total Protein 6.6 6.0 - 8.5 g/dL   Albumin 4.9 (H) 3.5 - 4.8 g/dL   Bilirubin Total 0.5 0.0 - 1.2 mg/dL   Bilirubin, Direct 0.14 0.00 - 0.40 mg/dL   Alkaline Phosphatase 76 39 - 117 IU/L   AST 32 0 - 40 IU/L   ALT 24 0 - 32 IU/L  UA/M w/rflx Culture, Routine     Status: Abnormal   Collection Time: 03/20/18 10:20 AM  Result Value Ref Range   Specific Gravity, UA 1.016 1.005 - 1.030   pH, UA 8.0 (H) 5.0 - 7.5   Color, UA Yellow Yellow   Appearance Ur Turbid (A) Clear   Leukocytes, UA Negative Negative   Protein, UA Negative Negative/Trace   Glucose, UA Negative Negative   Ketones, UA Negative Negative   RBC, UA Negative Negative   Bilirubin, UA Negative Negative   Urobilinogen, Ur 0.2 0.2 - 1.0 mg/dL   Nitrite, UA Negative Negative   Microscopic Examination Comment     Comment: Microscopic follows if indicated.   Microscopic Examination See below:     Comment: Microscopic was indicated and was performed.   Urinalysis Reflex Comment     Comment: This specimen will not reflex to a Urine Culture.  Microscopic Examination     Status: None   Collection Time: 03/20/18 10:20 AM  Result Value Ref Range   WBC, UA 0-5 0 - 5 /hpf   RBC, UA 0-2 0 - 2 /hpf   Epithelial Cells (non renal) 0-10 0 - 10 /hpf   Casts None seen None seen /lpf   Bacteria, UA None seen None seen/Few   Assessment/Plan: 1. Encounter for general adult medical examination with abnormal findings  - Updated all labs  -  UA/M w/rflx Culture, Routine  2. Fibrocystic breast changes of both breasts - MM Digital Diagnostic Bilat; Future  3. Mixed hyperlipidemia - Continue Crestor 10 mg po qd  - Lipid Panel With LDL/HDL Ratio  4. Visit for screening mammogram - MM Digital Diagnostic Bilat; Future  General Counseling: Vinisha verbalizes understanding of the findings of todays visit and agrees with plan of treatment. I have discussed any further diagnostic evaluation that may be needed or ordered  today. We also reviewed her medications today. she has been encouraged to call the office with any questions or concerns that should arise related to todays vi  Orders Placed This Encounter  Procedures  . Microscopic Examination  . MM Digital Diagnostic Bilat  . UA/M w/rflx Culture, Routine  . Lipid Panel With LDL/HDL Ratio    Time spent:30 [place], MD  Internal Medicine

## 2018-03-21 LAB — UA/M W/RFLX CULTURE, ROUTINE
Bilirubin, UA: NEGATIVE
Glucose, UA: NEGATIVE
Ketones, UA: NEGATIVE
LEUKOCYTES UA: NEGATIVE
Nitrite, UA: NEGATIVE
PH UA: 8 — AB (ref 5.0–7.5)
Protein, UA: NEGATIVE
RBC, UA: NEGATIVE
Specific Gravity, UA: 1.016 (ref 1.005–1.030)
Urobilinogen, Ur: 0.2 mg/dL (ref 0.2–1.0)

## 2018-03-21 LAB — MICROSCOPIC EXAMINATION
BACTERIA UA: NONE SEEN
CASTS: NONE SEEN /LPF

## 2018-04-19 ENCOUNTER — Telehealth: Payer: Self-pay | Admitting: Internal Medicine

## 2018-04-19 NOTE — Telephone Encounter (Signed)
Tried calling patient per Dr Clayborn Bigness to advise her that her Bone Density will be due in 2020 and she can schedule once she is ready, unable to reach patient. Beth

## 2018-04-24 DIAGNOSIS — L708 Other acne: Secondary | ICD-10-CM | POA: Diagnosis not present

## 2018-04-24 DIAGNOSIS — L821 Other seborrheic keratosis: Secondary | ICD-10-CM | POA: Diagnosis not present

## 2018-04-24 DIAGNOSIS — X32XXXA Exposure to sunlight, initial encounter: Secondary | ICD-10-CM | POA: Diagnosis not present

## 2018-04-24 DIAGNOSIS — L570 Actinic keratosis: Secondary | ICD-10-CM | POA: Diagnosis not present

## 2018-05-23 DIAGNOSIS — R928 Other abnormal and inconclusive findings on diagnostic imaging of breast: Secondary | ICD-10-CM | POA: Diagnosis not present

## 2018-05-23 DIAGNOSIS — Z9889 Other specified postprocedural states: Secondary | ICD-10-CM | POA: Diagnosis not present

## 2018-05-23 DIAGNOSIS — D0511 Intraductal carcinoma in situ of right breast: Secondary | ICD-10-CM | POA: Diagnosis not present

## 2018-05-23 DIAGNOSIS — R922 Inconclusive mammogram: Secondary | ICD-10-CM | POA: Diagnosis not present

## 2018-09-25 ENCOUNTER — Ambulatory Visit: Payer: Self-pay | Admitting: Internal Medicine

## 2018-10-02 ENCOUNTER — Encounter: Payer: Self-pay | Admitting: Internal Medicine

## 2018-10-02 ENCOUNTER — Other Ambulatory Visit: Payer: Self-pay

## 2018-10-02 ENCOUNTER — Ambulatory Visit (INDEPENDENT_AMBULATORY_CARE_PROVIDER_SITE_OTHER): Payer: Medicare Other | Admitting: Internal Medicine

## 2018-10-02 VITALS — Temp 97.8°F | Ht 65.0 in | Wt 118.0 lb

## 2018-10-02 DIAGNOSIS — M810 Age-related osteoporosis without current pathological fracture: Secondary | ICD-10-CM | POA: Diagnosis not present

## 2018-10-02 DIAGNOSIS — E782 Mixed hyperlipidemia: Secondary | ICD-10-CM

## 2018-10-02 DIAGNOSIS — I341 Nonrheumatic mitral (valve) prolapse: Secondary | ICD-10-CM

## 2018-10-02 NOTE — Progress Notes (Signed)
[facility] [place], [place] [address]  Internal MEDICINE  Telephone Visit  Patient Name: Maria Gonzalez  [id number]  [id number]  Date of Service: 10/02/2018  I connected with the patient at 9:26 by telephone and verified the patients identity using two identifiers.   I discussed the limitations, risks, security and privacy concerns of performing an evaluation and management service by telephone and the availability of in person appointments. I also discussed with the patient that there may be a patient responsible charge related to the service.  The patient expressed understanding and agrees to proceed.    Chief Complaint  Patient presents with  . Telephone Assessment  . Telephone Screen  . Hyperlipidemia    HPI  Pt is connected via webcam due t risk of pandenic of Covid 19. This is a routine follow up. She is feeling fine, no acute complaints, she did not take Boniva due to side effect profile, last bone density was over 2 years ago, continues to take crestor    Current Medication: Outpatient Encounter Medications as of 10/02/2018  Medication Sig Note  . acetaminophen (TYLENOL) 325 MG tablet Take 325 mg by mouth. 03/21/2016: Received from: [place]: Take 325 mg by mouth. Frequency:PRN   Dosage:325   MG  Instructions:  Note:Dose: 325MG   . Cyanocobalamin (VITAMIN B-12 CR) 1000 MCG TBCR Take by mouth. 03/21/2016: Received from: [place]: Take by mouth. Frequency:QD   Dosage:1000   MCG  Instructions:  Note:Dose: 1000MCG  . Magnesium 250 MG TABS Frequency:PRN   Dosage:0.0     Instructions:  Note:Dose: 1-2 TABS 03/21/2016: Received from: [facility]  . Multiple Vitamin (MULTIVITAMIN) capsule Take 1 capsule by mouth daily.   . Omega-3 Fatty Acids (FISH OIL) 1000 MG CAPS Take by mouth. 03/21/2016: Received from: [place]: Take by mouth. Frequency:QD   Dosage:0.0     Instructions:  Note:Dose: 800-200MG   .  rosuvastatin (CRESTOR) 10 MG tablet Take 1 tablet (10 mg total) by mouth daily.   . vitamin E 400 UNIT capsule Take 400 Units by mouth daily.   . Calcium Carbonate-Vit D-Min (CALCIUM 1200 PO) Take by mouth.   Javier Docker Oil 300 MG CAPS Take by mouth.   . Niacin (VITAMIN B-3 PO) Take by mouth.    No facility-administered encounter medications on file as of 10/02/2018.     Surgical History: Past Surgical History:  Procedure Laterality Date  . calcification removal    . right breast biopsy  2013   right breast calcification s/p radiation  . TONSILLECTOMY AND ADENOIDECTOMY      Medical History: Past Medical History:  Diagnosis Date  . Allergy   . Heart murmur   . History of chicken pox   . Hyperlipidemia   . Hypertension   . Mitral valve prolapse   . UTI (urinary tract infection)     Family History: Family History  Problem Relation Age of Onset  . Hypertension Mother   . Stroke Mother   . Heart disease Father   . Cervical cancer Maternal Grandmother 60  . Stroke Maternal Grandfather   . Colon cancer Neg Hx     Social History   Socioeconomic History  . Marital status: Married    Spouse name: Not on file  . Number of children: Not on file  . Years of education: Not on file  . Highest education level: Not on file  Occupational History  .  Not on file  Social Needs  . Financial resource strain: Not on file  . Food insecurity:    Worry: Not on file    Inability: Not on file  . Transportation needs:    Medical: Not on file    Non-medical: Not on file  Tobacco Use  . Smoking status: Never Smoker  . Smokeless tobacco: Never Used  Substance and Sexual Activity  . Alcohol use: No  . Drug use: No  . Sexual activity: Not on file  Lifestyle  . Physical activity:    Days per week: Not on file    Minutes per session: Not on file  . Stress: Not on file  Relationships  . Social connections:    Talks on phone: Not on file    Gets together: Not on file    Attends  religious service: Not on file    Active member of club or organization: Not on file    Attends meetings of clubs or organizations: Not on file    Relationship status: Not on file  . Intimate partner violence:    Fear of current or ex partner: Not on file    Emotionally abused: Not on file    Physically abused: Not on file    Forced sexual activity: Not on file  Other Topics Concern  . Not on file  Social History Narrative   Lives in [place]   Married.   Cats.      Enjoys shopping, [employer], [place].      Retired [employer] for [employer].      Horticulture degree.       Review of Systems  Constitutional: Negative for chills, diaphoresis and fatigue.  HENT: Negative for ear pain, postnasal drip and sinus pressure.   Eyes: Negative for photophobia, discharge, redness, itching and visual disturbance.  Respiratory: Negative for cough, shortness of breath and wheezing.   Cardiovascular: Negative for chest pain, palpitations and leg swelling.  Gastrointestinal: Negative for abdominal pain, constipation, diarrhea, nausea and vomiting.  Genitourinary: Negative for dysuria and flank pain.  Musculoskeletal: Negative for arthralgias, back pain, gait problem and neck pain.  Skin: Negative for color change.  Allergic/Immunologic: Negative for environmental allergies and food allergies.  Neurological: Negative for dizziness and headaches.  Hematological: Does not bruise/bleed easily.  Psychiatric/Behavioral: Negative for agitation, behavioral problems (depression) and hallucinations.    Vital Signs: Temp 97.8 F (36.6 C)   Ht 5\' 5"  (1.651 m)   Wt 118 lb (53.5 kg)   BMI 19.64 kg/m    Observation/Objective: Pt is pleasant to talk to, NAD, has been in the garden     Assessment/Plan: 1. Age related osteoporosis, unspecified pathological fracture presence - might need to see Rheumatology to explore other options  - DG Bone Density; Future  2. Mixed hyperlipidemia -  continue crestor   3. Mitral valve prolapse - Stable   General Counseling: [place] verbalizes understanding of the findings of today's phone visit and agrees with plan of treatment. I have discussed any further diagnostic evaluation that may be needed or ordered today. We also reviewed her medications today. she has been encouraged to call the office with any questions or concerns that should arise related to todays visit.    Orders Placed This Encounter  Procedures  . DG Bone Density    Time spent:15 Minutes    Dr Lavera Guise Internal medicine

## 2018-11-05 DIAGNOSIS — H2513 Age-related nuclear cataract, bilateral: Secondary | ICD-10-CM | POA: Diagnosis not present

## 2018-11-05 DIAGNOSIS — H524 Presbyopia: Secondary | ICD-10-CM | POA: Diagnosis not present

## 2018-11-05 DIAGNOSIS — H5203 Hypermetropia, bilateral: Secondary | ICD-10-CM | POA: Diagnosis not present

## 2018-11-05 DIAGNOSIS — H52223 Regular astigmatism, bilateral: Secondary | ICD-10-CM | POA: Diagnosis not present

## 2018-11-14 ENCOUNTER — Other Ambulatory Visit: Payer: Self-pay | Admitting: Internal Medicine

## 2019-01-08 ENCOUNTER — Other Ambulatory Visit: Payer: Self-pay

## 2019-01-08 NOTE — Progress Notes (Signed)
ERROR

## 2019-02-04 DIAGNOSIS — Z23 Encounter for immunization: Secondary | ICD-10-CM | POA: Diagnosis not present

## 2019-03-04 ENCOUNTER — Ambulatory Visit
Admission: RE | Admit: 2019-03-04 | Discharge: 2019-03-04 | Disposition: A | Discharge status: Home / Self Care | Payer: Medicare Other | Source: Ambulatory Visit | Attending: Internal Medicine | Admitting: Internal Medicine

## 2019-03-04 DIAGNOSIS — M810 Age-related osteoporosis without current pathological fracture: Secondary | ICD-10-CM | POA: Diagnosis not present

## 2019-03-05 NOTE — Progress Notes (Signed)
Discuss BMD on next visit

## 2019-03-19 ENCOUNTER — Other Ambulatory Visit: Payer: Self-pay

## 2019-03-19 ENCOUNTER — Encounter: Payer: Self-pay | Admitting: Internal Medicine

## 2019-03-19 ENCOUNTER — Ambulatory Visit (INDEPENDENT_AMBULATORY_CARE_PROVIDER_SITE_OTHER): Payer: Medicare Other | Admitting: Internal Medicine

## 2019-03-19 DIAGNOSIS — E782 Mixed hyperlipidemia: Secondary | ICD-10-CM | POA: Diagnosis not present

## 2019-03-19 DIAGNOSIS — Z79899 Other long term (current) drug therapy: Secondary | ICD-10-CM

## 2019-03-19 DIAGNOSIS — M810 Age-related osteoporosis without current pathological fracture: Secondary | ICD-10-CM | POA: Diagnosis not present

## 2019-03-19 DIAGNOSIS — Z86000 Personal history of in-situ neoplasm of breast: Secondary | ICD-10-CM

## 2019-03-19 DIAGNOSIS — Z0001 Encounter for general adult medical examination with abnormal findings: Secondary | ICD-10-CM | POA: Diagnosis not present

## 2019-03-19 MED ORDER — ROSUVASTATIN CALCIUM 10 MG PO TABS: 10.0000 mg | ORAL_TABLET | Freq: Every day | ORAL | 3 refills | Status: DC

## 2019-03-19 NOTE — Progress Notes (Signed)
[facility] [place], [place] [address]  Internal MEDICINE  Office Visit Note  Patient Name: Maria Gonzalez  [id number]  RN:[id number]  Date of Service: 03/19/2019  Chief Complaint  Patient presents with  . Medical Management of Chronic Issues  . Annual Exam    medicare wellness visit   . Hyperlipidemia  . Hypertension    bp runs high going to doctors office,this am 121/67 at home      HPI Pt is here for routine health maintenance examination.Pt has been feeling well. No major complaints. Has a healthy life style.She feels reluctant for osteoporosis treatment, will like to look into it further. Continues to take Crestor / coronary calcifications present. Bp is slightly elevated, brought in home readings which are normal  Mammograms are reviewed  . Breast lumpectomy 09-09-2011; 08-2011 pt had DCIS . Breast biopsy  . Radiation 10-2011   Current Medication: Outpatient Encounter Medications as of 03/19/2019  Medication Sig Note  . acetaminophen (TYLENOL) 325 MG tablet Take 325 mg by mouth. 03/21/2016: Received from: [place]: Take 325 mg by mouth. Frequency:PRN   Dosage:325   MG  Instructions:  Note:Dose: 325MG   . Calcium Carbonate-Vit D-Min (CALCIUM 1200 PO) Take by mouth.   . Cyanocobalamin (VITAMIN B-12 CR) 1000 MCG TBCR Take by mouth. 03/21/2016: Received from: [place]: Take by mouth. Frequency:QD   Dosage:1000   MCG  Instructions:  Note:Dose: 1000MCG  . Krill Oil 300 MG CAPS Take by mouth.   . Magnesium 250 MG TABS Frequency:PRN   Dosage:0.0     Instructions:  Note:Dose: 1-2 TABS 03/21/2016: Received from: [facility]  . Multiple Vitamin (MULTIVITAMIN) capsule Take 1 capsule by mouth daily.   . Niacin (VITAMIN B-3 PO) Take by mouth.   . Omega-3 Fatty Acids (FISH OIL) 1000 MG CAPS Take by mouth. 03/21/2016: Received from: [place]: Take by mouth. Frequency:QD   Dosage:0.0     Instructions:   Note:Dose: 800-200MG   . vitamin E 400 UNIT capsule Take 400 Units by mouth daily.   . [DISCONTINUED] rosuvastatin (CRESTOR) 10 MG tablet TAKE 1 TABLET DAILY.   . rosuvastatin (CRESTOR) 10 MG tablet Take 1 tablet (10 mg total) by mouth daily.    No facility-administered encounter medications on file as of 03/19/2019.     Surgical History: Past Surgical History:  Procedure Laterality Date  . calcification removal    . right breast biopsy  2013   right breast calcification s/p radiation  . TONSILLECTOMY AND ADENOIDECTOMY      Medical History: Past Medical History:  Diagnosis Date  . Allergy   . Heart murmur   . History of chicken pox   . Hyperlipidemia   . Hypertension   . Mitral valve prolapse   . UTI (urinary tract infection)     Family History: Family History  Problem Relation Age of Onset  . Hypertension Mother   . Stroke Mother   . Heart disease Father   . Cervical cancer Maternal Grandmother 60  . Stroke Maternal Grandfather   . Colon cancer Neg Hx       Review of Systems  Constitutional: Negative for chills, diaphoresis and fatigue.  HENT: Negative for ear pain, postnasal drip and sinus pressure.   Eyes: Negative for photophobia, discharge, redness, itching and visual disturbance.  Respiratory: Negative for cough, shortness of breath and wheezing.   Cardiovascular: Negative for chest pain, palpitations and leg swelling.  Gastrointestinal: Negative for abdominal pain, constipation, diarrhea, nausea and vomiting.  Genitourinary: Negative for dysuria and flank pain.  Musculoskeletal: Negative for arthralgias, back pain, gait problem and neck pain.  Skin: Negative for color change.  Allergic/Immunologic: Negative for environmental allergies and food allergies.  Neurological: Negative for dizziness and headaches.  Hematological: Does not bruise/bleed easily.  Psychiatric/Behavioral: Negative for agitation, behavioral problems (depression) and hallucinations.      Vital Signs: BP (!) 150/78   Pulse 88   Temp 97.8 F (36.6 C)   Resp 16   Ht 5\' 5"  (1.651 m)   Wt 118 lb 9.6 oz (53.8 kg)   SpO2 97%   BMI 19.74 kg/m    Physical Exam Constitutional:      General: She is not in acute distress.    Appearance: She is well-developed. She is not diaphoretic.  HENT:     Head: Normocephalic and atraumatic.     Mouth/Throat:     Pharynx: No oropharyngeal exudate.  Eyes:     Pupils: Pupils are equal, round, and reactive to light.  Neck:     Musculoskeletal: Normal range of motion and neck supple.     Thyroid: No thyromegaly.     Vascular: No JVD.     Trachea: No tracheal deviation.  Cardiovascular:     Rate and Rhythm: Normal rate and regular rhythm.     Heart sounds: Normal heart sounds. No murmur. No friction rub. No gallop.   Pulmonary:     Effort: Pulmonary effort is normal. No respiratory distress.     Breath sounds: No wheezing or rales.  Chest:     Chest wall: No tenderness.     Breasts:        Right: Normal.        Left: Normal.  Abdominal:     General: Bowel sounds are normal.     Palpations: Abdomen is soft.  Musculoskeletal: Normal range of motion.  Lymphadenopathy:     Cervical: No cervical adenopathy.  Skin:    General: Skin is warm and dry.  Neurological:     Mental Status: She is alert and oriented to person, place, and time.     Cranial Nerves: No cranial nerve deficit.  Psychiatric:        Behavior: Behavior normal.        Thought Content: Thought content normal.        Judgment: Judgment normal.    Assessment/Plan: 1. Encounter for general adult medical examination with abnormal findings - Pt is up to date on all preventive health maintenance   2. Mixed hyperlipidemia - Continue Crestor. Coronary calcifications present  - Lipid Panel With LDL/HDL Ratio - TSH - T4, free - rosuvastatin (CRESTOR) 10 MG tablet; Take 1 tablet (10 mg total) by mouth daily.  Dispense: 90 tablet; Refill: 3  3. Senile  osteoporosis - Information about different oral and Intravenous Diphosphonate is given, pt will call us once decided   4. Encounter for long-term (current) use of medications - Comprehensive metabolic panel - rosuvastatin (CRESTOR) 10 MG tablet; Take 1 tablet (10 mg total) by mouth daily.  Dispense: 90 tablet; Refill: 3  5. History of ductal carcinoma in situ (DCIS) of breast - Pt had breast conserving surgery, now gets yearly mammogram  General Counseling: Anjelina verbalizes understanding of the findings of todays visit and agrees with plan of treatment. I have discussed any further diagnostic evaluation that may be needed or ordered today. We also reviewed her medications  today. she has been encouraged to call the office with any questions or concerns that should arise related to todays visit.   Orders Placed This Encounter  Procedures  . CBC with Differential/Platelet  . Lipid Panel With LDL/HDL Ratio  . TSH  . T4, free  . Comprehensive metabolic panel    Meds ordered this encounter  Medications  . rosuvastatin (CRESTOR) 10 MG tablet    Sig: Take 1 tablet (10 mg total) by mouth daily.    Dispense:  90 tablet    Refill:  3   Time spent: Rose [place], MD  Internal Medicine

## 2019-04-23 DIAGNOSIS — D225 Melanocytic nevi of trunk: Secondary | ICD-10-CM | POA: Diagnosis not present

## 2019-04-23 DIAGNOSIS — D2262 Melanocytic nevi of left upper limb, including shoulder: Secondary | ICD-10-CM | POA: Diagnosis not present

## 2019-04-23 DIAGNOSIS — D2271 Melanocytic nevi of right lower limb, including hip: Secondary | ICD-10-CM | POA: Diagnosis not present

## 2019-04-23 DIAGNOSIS — D2272 Melanocytic nevi of left lower limb, including hip: Secondary | ICD-10-CM | POA: Diagnosis not present

## 2019-04-23 DIAGNOSIS — L821 Other seborrheic keratosis: Secondary | ICD-10-CM | POA: Diagnosis not present

## 2019-04-23 DIAGNOSIS — D2261 Melanocytic nevi of right upper limb, including shoulder: Secondary | ICD-10-CM | POA: Diagnosis not present

## 2019-05-08 LAB — T4, FREE: Free T4: 1.29 ng/dL (ref 0.82–1.77)

## 2019-05-08 LAB — COMPREHENSIVE METABOLIC PANEL
ALT: 25 IU/L (ref 0–32)
AST: 30 IU/L (ref 0–40)
Albumin/Globulin Ratio: 2.9 — ABNORMAL HIGH (ref 1.2–2.2)
Albumin: 4.7 g/dL (ref 3.7–4.7)
Alkaline Phosphatase: 71 IU/L (ref 39–117)
BUN/Creatinine Ratio: 25 (ref 12–28)
BUN: 17 mg/dL (ref 8–27)
Bilirubin Total: 0.6 mg/dL (ref 0.0–1.2)
CO2: 26 mmol/L (ref 20–29)
Calcium: 9 mg/dL (ref 8.7–10.3)
Chloride: 104 mmol/L (ref 96–106)
Creatinine, Ser: 0.67 mg/dL (ref 0.57–1.00)
GFR calc Af Amer: 102 mL/min/{1.73_m2} (ref >59)
GFR calc non Af Amer: 88 mL/min/{1.73_m2} (ref >59)
Globulin, Total: 1.6 g/dL (ref 1.5–4.5)
Glucose: 91 mg/dL (ref 65–99)
Potassium: 3.9 mmol/L (ref 3.5–5.2)
Sodium: 143 mmol/L (ref 134–144)
Total Protein: 6.3 g/dL (ref 6.0–8.5)

## 2019-05-08 LAB — CBC WITH DIFFERENTIAL/PLATELET
Basophils Absolute: 0.1 10*3/uL (ref 0.0–0.2)
Basos: 1 %
EOS (ABSOLUTE): 0.1 10*3/uL (ref 0.0–0.4)
Eos: 2 %
Hematocrit: 44.9 % (ref 34.0–46.6)
Hemoglobin: 14.8 g/dL (ref 11.1–15.9)
Immature Grans (Abs): 0 10*3/uL (ref 0.0–0.1)
Immature Granulocytes: 0 %
Lymphocytes Absolute: 1.6 10*3/uL (ref 0.7–3.1)
Lymphs: 30 %
MCH: 31.8 pg (ref 26.6–33.0)
MCHC: 33 g/dL (ref 31.5–35.7)
MCV: 97 fL (ref 79–97)
Monocytes Absolute: 0.5 10*3/uL (ref 0.1–0.9)
Monocytes: 10 %
Neutrophils Absolute: 3 10*3/uL (ref 1.4–7.0)
Neutrophils: 57 %
Platelets: 202 10*3/uL (ref 150–450)
RBC: 4.65 x10E6/uL (ref 3.77–5.28)
RDW: 12.2 % (ref 11.7–15.4)
WBC: 5.3 10*3/uL (ref 3.4–10.8)

## 2019-05-08 LAB — LIPID PANEL WITH LDL/HDL RATIO
Cholesterol, Total: 156 mg/dL (ref 100–199)
HDL: 56 mg/dL (ref >39)
LDL Chol Calc (NIH): 85 mg/dL (ref 0–99)
LDL/HDL Ratio: 1.5 ratio (ref 0.0–3.2)
Triglycerides: 79 mg/dL (ref 0–149)
VLDL Cholesterol Cal: 15 mg/dL (ref 5–40)

## 2019-05-08 LAB — TSH: TSH: 1.94 u[IU]/mL (ref 0.450–4.500)

## 2019-05-28 DIAGNOSIS — Z1231 Encounter for screening mammogram for malignant neoplasm of breast: Secondary | ICD-10-CM | POA: Diagnosis not present

## 2019-06-04 ENCOUNTER — Telehealth: Payer: Self-pay

## 2019-06-04 NOTE — Telephone Encounter (Signed)
Pt advised labs look good and mailed copy of labs

## 2019-06-05 ENCOUNTER — Telehealth: Payer: Self-pay

## 2019-06-05 NOTE — Telephone Encounter (Signed)
-----   Message from Lavera Guise, MD sent at 06/05/2019  8:27 AM EST ----- Labs are normal

## 2019-06-05 NOTE — Telephone Encounter (Signed)
PT WAS NOTIFIED. PT REQUESTED TO HAVE LAB RESULTS MAILED. I WILL BE MAILING THESE OUT TODAY.

## 2019-06-13 DIAGNOSIS — H04123 Dry eye syndrome of bilateral lacrimal glands: Secondary | ICD-10-CM | POA: Diagnosis not present

## 2019-06-13 DIAGNOSIS — H5203 Hypermetropia, bilateral: Secondary | ICD-10-CM | POA: Diagnosis not present

## 2019-06-13 DIAGNOSIS — H2513 Age-related nuclear cataract, bilateral: Secondary | ICD-10-CM | POA: Diagnosis not present

## 2019-06-13 DIAGNOSIS — H524 Presbyopia: Secondary | ICD-10-CM | POA: Diagnosis not present

## 2019-06-13 DIAGNOSIS — H52223 Regular astigmatism, bilateral: Secondary | ICD-10-CM | POA: Diagnosis not present

## 2019-06-25 ENCOUNTER — Ambulatory Visit (INDEPENDENT_AMBULATORY_CARE_PROVIDER_SITE_OTHER): Payer: Medicare Other | Admitting: Internal Medicine

## 2019-06-25 ENCOUNTER — Encounter: Payer: Self-pay | Admitting: Internal Medicine

## 2019-06-25 DIAGNOSIS — Z7189 Other specified counseling: Secondary | ICD-10-CM

## 2019-06-25 DIAGNOSIS — M810 Age-related osteoporosis without current pathological fracture: Secondary | ICD-10-CM | POA: Diagnosis not present

## 2019-06-25 DIAGNOSIS — E782 Mixed hyperlipidemia: Secondary | ICD-10-CM

## 2019-06-25 NOTE — Progress Notes (Signed)
[facility] [place], [place] [address]  Internal MEDICINE  Telephone Visit  Patient Name: Maria Gonzalez  [id number]  RN:[id number]  Date of Service: 06/25/2019  I connected with the patient at 1100 by virtual visit and verified the patients identity using two identifiers.   I discussed the limitations, risks, security and privacy concerns of performing an evaluation and management service by telephone and the availability of in person appointments. I also discussed with the patient that there may be a patient responsible charge related to the service.  The patient expressed understanding and agrees to proceed.    Chief Complaint  Patient presents with  . Telephone Assessment  . Telephone Screen  . Hypertension  . Hyperlipidemia  . Follow-up    labs     HPI  Pt is concerned about her labs being elevated ( albumin/globulin) ratio, denies any other problems, lipid profile is within target, will like to know if she can decrease her Crestor. Pt has an appointment for possible IV treatment for osteoporosis, she is intolerance to oral medications in the past      Current Medication: Outpatient Encounter Medications as of 06/25/2019  Medication Sig Note  . acetaminophen (TYLENOL) 325 MG tablet Take 325 mg by mouth. 03/21/2016: Received from: [place]: Take 325 mg by mouth. Frequency:PRN   Dosage:325   MG  Instructions:  Note:Dose: 325MG   . aspirin EC 81 MG tablet Take 81 mg by mouth daily.   . Calcium Carbonate-Vit D-Min (CALCIUM 1200 PO) Take by mouth.   . Cyanocobalamin (VITAMIN B-12 CR) 1000 MCG TBCR Take by mouth. 03/21/2016: Received from: [place]: Take by mouth. Frequency:QD   Dosage:1000   MCG  Instructions:  Note:Dose: 1000MCG  . Krill Oil 300 MG CAPS Take by mouth.   . Magnesium 250 MG TABS Frequency:PRN   Dosage:0.0     Instructions:  Note:Dose: 1-2 TABS 03/21/2016: Received from: [facility]  . Multiple Vitamin  (MULTIVITAMIN) capsule Take 1 capsule by mouth daily.   . rosuvastatin (CRESTOR) 10 MG tablet Take 1 tablet (10 mg total) by mouth daily.   . vitamin E 400 UNIT capsule Take 400 Units by mouth daily.   . Niacin (VITAMIN B-3 PO) Take by mouth.   . Omega-3 Fatty Acids (FISH OIL) 1000 MG CAPS Take by mouth. 03/21/2016: Received from: [place]: Take by mouth. Frequency:QD   Dosage:0.0     Instructions:  Note:Dose: 800-200MG    No facility-administered encounter medications on file as of 06/25/2019.    Surgical History: Past Surgical History:  Procedure Laterality Date  . calcification removal    . right breast biopsy  2013   right breast calcification s/p radiation  . TONSILLECTOMY AND ADENOIDECTOMY      Medical History: Past Medical History:  Diagnosis Date  . Allergy   . Heart murmur   . History of chicken pox   . Hyperlipidemia   . Hypertension   . Mitral valve prolapse   . UTI (urinary tract infection)     Family History: Family History  Problem Relation Age of Onset  . Hypertension Mother   . Stroke Mother   . Heart disease Father   . Cervical cancer Maternal Grandmother 60  . Stroke Maternal Grandfather   . Colon cancer Neg Hx     Social History   Socioeconomic History  . Marital status: Married    Spouse name: Not on file  .  Number of children: Not on file  . Years of education: Not on file  . Highest education level: Not on file  Occupational History  . Not on file  Tobacco Use  . Smoking status: Never Smoker  . Smokeless tobacco: Never Used  Substance and Sexual Activity  . Alcohol use: No  . Drug use: No  . Sexual activity: Not on file  Other Topics Concern  . Not on file  Social History Narrative   Lives in [place]   Married.   Cats.      Enjoys shopping, [place], [place].      Retired [employer] for [employer].      Horticulture degree.    Social Determinants of Health   Financial Resource Strain:   . Difficulty  of Paying Living Expenses: Not on file  Food Insecurity:   . Worried About Charity fundraiser in the Last Year: Not on file  . Ran Out of Food in the Last Year: Not on file  Transportation Needs:   . Lack of Transportation (Medical): Not on file  . Lack of Transportation (Non-Medical): Not on file  Physical Activity:   . Days of Exercise per Week: Not on file  . Minutes of Exercise per Session: Not on file  Stress:   . Feeling of Stress : Not on file  Social Connections:   . Frequency of Communication with Friends and Family: Not on file  . Frequency of Social Gatherings with Friends and Family: Not on file  . Attends Religious Services: Not on file  . Active Member of Clubs or Organizations: Not on file  . Attends Archivist Meetings: Not on file  . Marital Status: Not on file  Intimate Partner Violence:   . Fear of Current or Ex-Partner: Not on file  . Emotionally Abused: Not on file  . Physically Abused: Not on file  . Sexually Abused: Not on file    Review of Systems  Constitutional: Negative.   HENT: Negative.   Respiratory: Negative.   Cardiovascular: Negative.   Gastrointestinal: Negative.   Allergic/Immunologic: Negative.   Neurological: Negative.     Vital Signs: Ht 5\' 5"  (1.651 m)   Wt 117 lb (53.1 kg)   BMI 19.47 kg/m    Observation/Objective: No exam is done due to virtual visit   Assessment/Plan: 1. Encounter for medication review and counseling - All labs discussed with her, slightly elevated albumin/ Globulin ration which is insignificant. Reassurance is given   2. Mixed hyperlipidemia - Pt may reduce Crestor to 10 mg po qod   3. Senile osteoporosis - Pt does have osteoporosis and will see endo for possible IV therapy  General Counseling: Lailie verbalizes understanding of the findings of today's phone visit and agrees with plan of treatment. I have discussed any further diagnostic evaluation that may be needed or ordered today. We also  reviewed her medications today. she has been encouraged to call the office with any questions or concerns that should arise related to todays visit.   Time spent: 51 Minutes    Dr Lavera Guise Internal medicine

## 2019-07-04 DIAGNOSIS — M810 Age-related osteoporosis without current pathological fracture: Secondary | ICD-10-CM | POA: Diagnosis not present

## 2019-07-04 DIAGNOSIS — E559 Vitamin D deficiency, unspecified: Secondary | ICD-10-CM | POA: Diagnosis not present

## 2019-07-26 DIAGNOSIS — M810 Age-related osteoporosis without current pathological fracture: Secondary | ICD-10-CM | POA: Diagnosis not present

## 2019-10-14 DIAGNOSIS — L538 Other specified erythematous conditions: Secondary | ICD-10-CM | POA: Diagnosis not present

## 2019-10-14 DIAGNOSIS — L820 Inflamed seborrheic keratosis: Secondary | ICD-10-CM | POA: Diagnosis not present

## 2019-12-16 DIAGNOSIS — H524 Presbyopia: Secondary | ICD-10-CM | POA: Diagnosis not present

## 2019-12-16 DIAGNOSIS — H5203 Hypermetropia, bilateral: Secondary | ICD-10-CM | POA: Diagnosis not present

## 2019-12-16 DIAGNOSIS — H2513 Age-related nuclear cataract, bilateral: Secondary | ICD-10-CM | POA: Diagnosis not present

## 2019-12-16 DIAGNOSIS — H52223 Regular astigmatism, bilateral: Secondary | ICD-10-CM | POA: Diagnosis not present

## 2019-12-16 DIAGNOSIS — H04123 Dry eye syndrome of bilateral lacrimal glands: Secondary | ICD-10-CM | POA: Diagnosis not present

## 2020-01-08 ENCOUNTER — Encounter: Payer: Self-pay | Admitting: Internal Medicine

## 2020-01-08 ENCOUNTER — Other Ambulatory Visit: Payer: Self-pay

## 2020-01-08 ENCOUNTER — Ambulatory Visit (INDEPENDENT_AMBULATORY_CARE_PROVIDER_SITE_OTHER): Payer: Medicare Other | Admitting: Internal Medicine

## 2020-01-08 VITALS — BP 140/85 | HR 97 | Temp 98.0°F | Resp 16 | Ht 65.0 in | Wt 115.6 lb

## 2020-01-08 DIAGNOSIS — N6310 Unspecified lump in the right breast, unspecified quadrant: Secondary | ICD-10-CM | POA: Diagnosis not present

## 2020-01-08 DIAGNOSIS — Z9889 Other specified postprocedural states: Secondary | ICD-10-CM | POA: Diagnosis not present

## 2020-01-08 NOTE — Progress Notes (Signed)
[facility] [place], [place] [address]  Internal MEDICINE  Office Visit Note  Patient Name: Maria Gonzalez  [id number]  [id number]  Date of Service: 01/08/2020  Chief Complaint  Patient presents with  . Acute Visit    lump on right breast    HPI  Pt is here with c/o new lump in her right breast. She denies ay trauma or pain. Does have h/o lumpectomy in the same breast. She gets yearly mammogram. No other complaints   Current Medication: Outpatient Encounter Medications as of 01/08/2020  Medication Sig Note  . acetaminophen (TYLENOL) 325 MG tablet Take 325 mg by mouth. 03/21/2016: Received from: [place]: Take 325 mg by mouth. Frequency:PRN   Dosage:325   MG  Instructions:  Note:Dose: 325MG   . aspirin EC 81 MG tablet Take 81 mg by mouth daily.   . Calcium Carbonate-Vit D-Min (CALCIUM 1200 PO) Take by mouth.   . Cyanocobalamin (VITAMIN B-12 CR) 1000 MCG TBCR Take by mouth. 03/21/2016: Received from: [place]: Take by mouth. Frequency:QD   Dosage:1000   MCG  Instructions:  Note:Dose: 1000MCG  . Krill Oil 300 MG CAPS Take by mouth.   . Magnesium 250 MG TABS Frequency:PRN   Dosage:0.0     Instructions:  Note:Dose: 1-2 TABS 03/21/2016: Received from: [facility]  . Multiple Vitamin (MULTIVITAMIN) capsule Take 1 capsule by mouth daily.   . Niacin (VITAMIN B-3 PO) Take by mouth.   . Omega-3 Fatty Acids (FISH OIL) 1000 MG CAPS Take by mouth. 03/21/2016: Received from: [place]: Take by mouth. Frequency:QD   Dosage:0.0     Instructions:  Note:Dose: 800-200MG   . rosuvastatin (CRESTOR) 10 MG tablet Take 1 tablet (10 mg total) by mouth daily.   . vitamin E 400 UNIT capsule Take 400 Units by mouth daily.    No facility-administered encounter medications on file as of 01/08/2020.    Surgical History: Past Surgical History:  Procedure Laterality Date  . calcification removal    . right breast biopsy  2013    right breast calcification s/p radiation  . TONSILLECTOMY AND ADENOIDECTOMY      Medical History: Past Medical History:  Diagnosis Date  . Allergy   . Heart murmur   . History of chicken pox   . Hyperlipidemia   . Hypertension   . Mitral valve prolapse   . UTI (urinary tract infection)     Family History: Family History  Problem Relation Age of Onset  . Hypertension Mother   . Stroke Mother   . Heart disease Father   . Cervical cancer Maternal Grandmother 60  . Stroke Maternal Grandfather   . Colon cancer Neg Hx     Social History   Socioeconomic History  . Marital status: Married    Spouse name: Not on file  . Number of children: Not on file  . Years of education: Not on file  . Highest education level: Not on file  Occupational History  . Not on file  Tobacco Use  . Smoking status: Never Smoker  . Smokeless tobacco: Never Used  Substance and Sexual Activity  . Alcohol use: No  . Drug use: No  . Sexual activity: Not on file  Other Topics Concern  . Not on file  Social History Narrative   Lives in [place]   Married.   Cats.      Enjoys shopping, [place], [place].  Retired [employer] for [employer].      Horticulture degree.    Social Determinants of Health   Financial Resource Strain:   . Difficulty of Paying Living Expenses: Not on file  Food Insecurity:   . Worried About Charity fundraiser in the Last Year: Not on file  . Ran Out of Food in the Last Year: Not on file  Transportation Needs:   . Lack of Transportation (Medical): Not on file  . Lack of Transportation (Non-Medical): Not on file  Physical Activity:   . Days of Exercise per Week: Not on file  . Minutes of Exercise per Session: Not on file  Stress:   . Feeling of Stress : Not on file  Social Connections:   . Frequency of Communication with Friends and Family: Not on file  . Frequency of Social Gatherings with Friends and Family: Not on file  . Attends Religious Services:  Not on file  . Active Member of Clubs or Organizations: Not on file  . Attends Archivist Meetings: Not on file  . Marital Status: Not on file  Intimate Partner Violence:   . Fear of Current or Ex-Partner: Not on file  . Emotionally Abused: Not on file  . Physically Abused: Not on file  . Sexually Abused: Not on file      Review of Systems  Constitutional: Negative.   Cardiovascular: Negative.   Neurological: Negative.   Psychiatric/Behavioral: Negative.     Vital Signs: BP 140/85   Pulse 97   Temp 98 F (36.7 C)   Resp 16   Ht 5\' 5"  (1.651 m)   Wt 115 lb 9.6 oz (52.4 kg)   SpO2 98%   BMI 19.24 kg/m    Physical Exam Chest:       Assessment/Plan: 1. S/P breast lumpectomy - Scar tissue is felt  2. Lump of right breast - Will schedule diagnostic right breast mammogram followed by U/S if needed   General Counseling: Raegen verbalizes understanding of the findings of todays visit and agrees with plan of treatment. I have discussed any further diagnostic evaluation that may be needed or ordered today. We also reviewed her medications today. she has been encouraged to call the office with any questions or concerns that should arise related to todays visit.   Total time spent:25 Minutes Time spent includes review of chart, medications, test results, and follow up plan with the patient.      Dr Lavera Guise Internal medicine

## 2020-01-10 DIAGNOSIS — N6310 Unspecified lump in the right breast, unspecified quadrant: Secondary | ICD-10-CM | POA: Diagnosis not present

## 2020-01-10 DIAGNOSIS — N6311 Unspecified lump in the right breast, upper outer quadrant: Secondary | ICD-10-CM | POA: Diagnosis not present

## 2020-01-10 DIAGNOSIS — Z853 Personal history of malignant neoplasm of breast: Secondary | ICD-10-CM | POA: Diagnosis not present

## 2020-01-10 DIAGNOSIS — R922 Inconclusive mammogram: Secondary | ICD-10-CM | POA: Diagnosis not present

## 2020-01-20 DIAGNOSIS — I10 Essential (primary) hypertension: Secondary | ICD-10-CM | POA: Diagnosis not present

## 2020-01-20 DIAGNOSIS — Z780 Asymptomatic menopausal state: Secondary | ICD-10-CM | POA: Diagnosis not present

## 2020-01-20 DIAGNOSIS — Z9289 Personal history of other medical treatment: Secondary | ICD-10-CM | POA: Diagnosis not present

## 2020-01-20 DIAGNOSIS — Z885 Allergy status to narcotic agent status: Secondary | ICD-10-CM | POA: Diagnosis not present

## 2020-01-20 DIAGNOSIS — Z880 Allergy status to penicillin: Secondary | ICD-10-CM | POA: Diagnosis not present

## 2020-01-20 DIAGNOSIS — R928 Other abnormal and inconclusive findings on diagnostic imaging of breast: Secondary | ICD-10-CM | POA: Diagnosis not present

## 2020-01-20 DIAGNOSIS — Z87891 Personal history of nicotine dependence: Secondary | ICD-10-CM | POA: Diagnosis not present

## 2020-01-20 DIAGNOSIS — N6312 Unspecified lump in the right breast, upper inner quadrant: Secondary | ICD-10-CM | POA: Diagnosis not present

## 2020-01-20 DIAGNOSIS — N6311 Unspecified lump in the right breast, upper outer quadrant: Secondary | ICD-10-CM | POA: Diagnosis not present

## 2020-01-20 DIAGNOSIS — Z923 Personal history of irradiation: Secondary | ICD-10-CM | POA: Diagnosis not present

## 2020-01-20 DIAGNOSIS — Z882 Allergy status to sulfonamides status: Secondary | ICD-10-CM | POA: Diagnosis not present

## 2020-01-20 DIAGNOSIS — Z853 Personal history of malignant neoplasm of breast: Secondary | ICD-10-CM | POA: Diagnosis not present

## 2020-01-20 DIAGNOSIS — N6031 Fibrosclerosis of right breast: Secondary | ICD-10-CM | POA: Diagnosis not present

## 2020-01-20 DIAGNOSIS — N6310 Unspecified lump in the right breast, unspecified quadrant: Secondary | ICD-10-CM | POA: Diagnosis not present

## 2020-01-20 DIAGNOSIS — M810 Age-related osteoporosis without current pathological fracture: Secondary | ICD-10-CM | POA: Diagnosis not present

## 2020-02-14 DIAGNOSIS — Z23 Encounter for immunization: Secondary | ICD-10-CM | POA: Diagnosis not present

## 2020-03-24 ENCOUNTER — Encounter: Payer: Self-pay | Admitting: Internal Medicine

## 2020-03-24 ENCOUNTER — Other Ambulatory Visit: Payer: Self-pay

## 2020-03-24 ENCOUNTER — Ambulatory Visit (INDEPENDENT_AMBULATORY_CARE_PROVIDER_SITE_OTHER): Payer: Medicare Other | Admitting: Internal Medicine

## 2020-03-24 VITALS — BP 130/71 | HR 88 | Temp 97.6°F | Resp 16 | Ht 65.0 in | Wt 114.2 lb

## 2020-03-24 DIAGNOSIS — R030 Elevated blood-pressure reading, without diagnosis of hypertension: Secondary | ICD-10-CM | POA: Diagnosis not present

## 2020-03-24 DIAGNOSIS — Z9889 Other specified postprocedural states: Secondary | ICD-10-CM

## 2020-03-24 DIAGNOSIS — R300 Dysuria: Secondary | ICD-10-CM | POA: Diagnosis not present

## 2020-03-24 DIAGNOSIS — E782 Mixed hyperlipidemia: Secondary | ICD-10-CM | POA: Diagnosis not present

## 2020-03-24 DIAGNOSIS — M810 Age-related osteoporosis without current pathological fracture: Secondary | ICD-10-CM

## 2020-03-24 DIAGNOSIS — Z0001 Encounter for general adult medical examination with abnormal findings: Secondary | ICD-10-CM

## 2020-03-24 NOTE — Progress Notes (Signed)
[facility] [place], [place] [address]  Internal MEDICINE  Office Visit Note  Patient Name: Maria Gonzalez  [id number]  [id number]  Date of Service: 03/24/2020  Chief Complaint  Patient presents with  . Medicare Wellness    results  . Hypertension  . Hyperlipidemia  . policy update form    received    HPI Pt is here for routine health maintenance examination. She feels well. Walks on a regular basis  Takes Crestor on a regular basis. BMD showed osteoporosis and had infusion of reclast on 2021, denies any c/p or sob. She is willing to get Cologuard She will like to discuss her recent breast biopsy.    Following results were noted from [place].  Recent breast biopsy showed following results.Breast, right, prior lumpectomy site, core biopsy benign breast tissue with dense stromal fibrosis,  Microcalcifications identified  Current Medication: Outpatient Encounter Medications as of 03/24/2020  Medication Sig Note  . acetaminophen (TYLENOL) 325 MG tablet Take 325 mg by mouth. 03/21/2016: Received from: [place]: Take 325 mg by mouth. Frequency:PRN   Dosage:325   MG  Instructions:  Note:Dose: 325MG   . aspirin EC 81 MG tablet Take 81 mg by mouth daily.   . Calcium Carbonate-Vit D-Min (CALCIUM 1200 PO) Take by mouth.   . Cyanocobalamin (VITAMIN B-12 CR) 1000 MCG TBCR Take by mouth. 03/21/2016: Received from: [place]: Take by mouth. Frequency:QD   Dosage:1000   MCG  Instructions:  Note:Dose: 1000MCG  . Krill Oil 300 MG CAPS Take by mouth.   . Magnesium 250 MG TABS Frequency:PRN   Dosage:0.0     Instructions:  Note:Dose: 1-2 TABS 03/21/2016: Received from: [facility]  . Multiple Vitamin (MULTIVITAMIN) capsule Take 1 capsule by mouth daily.   . Niacin (VITAMIN B-3 PO) Take by mouth.   . Omega-3 Fatty Acids (FISH OIL) 1000 MG CAPS Take by mouth. 03/21/2016: Received from: [place]: Take by  mouth. Frequency:QD   Dosage:0.0     Instructions:  Note:Dose: 800-200MG   . rosuvastatin (CRESTOR) 10 MG tablet Take 1 tablet (10 mg total) by mouth daily.   . vitamin E 400 UNIT capsule Take 400 Units by mouth daily.    No facility-administered encounter medications on file as of 03/24/2020.    Surgical History: Past Surgical History:  Procedure Laterality Date  . calcification removal    . right breast biopsy  2013   right breast calcification s/p radiation  . TONSILLECTOMY AND ADENOIDECTOMY      Medical History: Past Medical History:  Diagnosis Date  . Allergy   . Heart murmur   . History of chicken pox   . Hyperlipidemia   . Hypertension   . Mitral valve prolapse   . UTI (urinary tract infection)     Family History: Family History  Problem Relation Age of Onset  . Hypertension Mother   . Stroke Mother   . Heart disease Father   . Cervical cancer Maternal Grandmother 60  . Stroke Maternal Grandfather   . Colon cancer Neg Hx       Review of Systems  Constitutional: Negative for chills, diaphoresis and fatigue.  HENT: Negative for ear pain, postnasal drip and sinus pressure.   Eyes: Negative for photophobia, discharge, redness, itching and visual disturbance.  Respiratory: Negative for cough, shortness of breath and wheezing.   Cardiovascular: Negative for chest pain, palpitations and leg swelling.  Gastrointestinal: Negative for  abdominal pain, constipation, diarrhea, nausea and vomiting.  Genitourinary: Negative for dysuria and flank pain.  Musculoskeletal: Negative for arthralgias, back pain, gait problem and neck pain.  Skin: Negative for color change.  Allergic/Immunologic: Negative for environmental allergies and food allergies.  Neurological: Negative for dizziness and headaches.  Hematological: Does not bruise/bleed easily.  Psychiatric/Behavioral: Negative for agitation, behavioral problems (depression) and hallucinations.     Vital Signs: BP  130/71 Comment: 166/86  Pulse 88   Temp 97.6 F (36.4 C)   Resp 16   Ht 5\' 5"  (1.651 m)   Wt 114 lb 3.2 oz (51.8 kg)   SpO2 99%   BMI 19.00 kg/m    Physical Exam Constitutional:      General: She is not in acute distress.    Appearance: She is well-developed. She is not diaphoretic.  HENT:     Head: Normocephalic and atraumatic.     Mouth/Throat:     Pharynx: No oropharyngeal exudate.  Eyes:     Pupils: Pupils are equal, round, and reactive to light.  Neck:     Thyroid: No thyromegaly.     Vascular: No JVD.     Trachea: No tracheal deviation.  Cardiovascular:     Rate and Rhythm: Normal rate and regular rhythm.     Heart sounds: Normal heart sounds. No murmur heard.  No friction rub. No gallop.   Pulmonary:     Effort: Pulmonary effort is normal. No respiratory distress.     Breath sounds: No wheezing or rales.  Chest:     Chest wall: No tenderness.     Breasts:        Right: Normal.        Left: Normal.     Comments: Right breast scar tissue  Abdominal:     General: Bowel sounds are normal.     Palpations: Abdomen is soft.  Musculoskeletal:        General: Normal range of motion.     Cervical back: Normal range of motion and neck supple.  Lymphadenopathy:     Cervical: No cervical adenopathy.  Skin:    General: Skin is warm and dry.  Neurological:     Mental Status: She is alert and oriented to person, place, and time.     Cranial Nerves: No cranial nerve deficit.  Psychiatric:        Behavior: Behavior normal.        Thought Content: Thought content normal.        Judgment: Judgment normal.    Assessment/Plan: 1. Encounter for general adult medical examination with abnormal findings Age appropriate labs are ordered for PHM and monitoring her HLD   2. White coat syndrome without diagnosis of hypertension All home readings are normal, she will continue to monitor   3. Mixed hyperlipidemia Continue Crestor as before   4. S/P breast  lumpectomy Monitored by [facility]   5. Senile osteoporosis Pt is getting IV reclast.   6. Dysuria - UA/M w/rflx Culture, Routine  General Counseling: [name] verbalizes understanding of the findings of todays visit and agrees with plan of treatment. I have discussed any further diagnostic evaluation that may be needed or ordered today. We also reviewed her medications today. she has been encouraged to call the office with any questions or concerns that should arise related to todays visit.   Orders Placed This Encounter  Procedures  . UA/M w/rflx Culture, Routine  . Lipid Panel With LDL/HDL Ratio  Total time spent: 35 Minutes  Time spent includes review of chart, medications, test results, and follow up plan with the patient.     Lavera Guise, MD  Internal Medicine

## 2020-03-25 LAB — MICROSCOPIC EXAMINATION
Bacteria, UA: NONE SEEN
Casts: NONE SEEN /lpf
Epithelial Cells (non renal): NONE SEEN /hpf (ref 0–10)
WBC, UA: NONE SEEN /hpf (ref 0–5)

## 2020-03-25 LAB — UA/M W/RFLX CULTURE, ROUTINE
Bilirubin, UA: NEGATIVE
Glucose, UA: NEGATIVE
Ketones, UA: NEGATIVE
Leukocytes,UA: NEGATIVE
Nitrite, UA: NEGATIVE
Protein,UA: NEGATIVE
RBC, UA: NEGATIVE
Specific Gravity, UA: 1.009 (ref 1.005–1.030)
Urobilinogen, Ur: 0.2 mg/dL (ref 0.2–1.0)
pH, UA: 7.5 (ref 5.0–7.5)

## 2020-03-30 ENCOUNTER — Other Ambulatory Visit: Payer: Self-pay

## 2020-04-01 ENCOUNTER — Telehealth: Payer: Self-pay

## 2020-04-01 NOTE — Telephone Encounter (Signed)
-----   Message from Lavera Guise, MD sent at 03/24/2020 10:57 AM EST ----- Cologaurd

## 2020-04-01 NOTE — Telephone Encounter (Signed)
Faxed cologuard 

## 2020-04-21 DIAGNOSIS — Z1212 Encounter for screening for malignant neoplasm of rectum: Secondary | ICD-10-CM | POA: Diagnosis not present

## 2020-04-21 DIAGNOSIS — Z1211 Encounter for screening for malignant neoplasm of colon: Secondary | ICD-10-CM | POA: Diagnosis not present

## 2020-04-22 DIAGNOSIS — D225 Melanocytic nevi of trunk: Secondary | ICD-10-CM | POA: Diagnosis not present

## 2020-04-22 DIAGNOSIS — D2272 Melanocytic nevi of left lower limb, including hip: Secondary | ICD-10-CM | POA: Diagnosis not present

## 2020-04-22 DIAGNOSIS — L570 Actinic keratosis: Secondary | ICD-10-CM | POA: Diagnosis not present

## 2020-04-22 DIAGNOSIS — D2271 Melanocytic nevi of right lower limb, including hip: Secondary | ICD-10-CM | POA: Diagnosis not present

## 2020-04-22 DIAGNOSIS — R202 Paresthesia of skin: Secondary | ICD-10-CM | POA: Diagnosis not present

## 2020-04-22 DIAGNOSIS — D2262 Melanocytic nevi of left upper limb, including shoulder: Secondary | ICD-10-CM | POA: Diagnosis not present

## 2020-04-22 DIAGNOSIS — D2261 Melanocytic nevi of right upper limb, including shoulder: Secondary | ICD-10-CM | POA: Diagnosis not present

## 2020-04-22 DIAGNOSIS — X32XXXA Exposure to sunlight, initial encounter: Secondary | ICD-10-CM | POA: Diagnosis not present

## 2020-04-22 DIAGNOSIS — L738 Other specified follicular disorders: Secondary | ICD-10-CM | POA: Diagnosis not present

## 2020-04-22 DIAGNOSIS — L821 Other seborrheic keratosis: Secondary | ICD-10-CM | POA: Diagnosis not present

## 2020-04-23 IMAGING — CT CT HEART SCORING
2 series · 16 of 20 positions shown, 18 images · non-contrast
Comparison: None.

CLINICAL DATA: Risk stratification

EXAM:
Coronary Calcium Score
TECHNIQUE: The patient was scanned on a Siemens Somatom 64 slice scanner. Axial
non-contrast 3 mm slices were carried out through the heart. The
data set was analyzed on a dedicated work station and scored using
the Agatson method.

[Series 2: casc 3.0 i36f 2 bestdiast 75 % · axial · 0.30mm/px · z∈[-275,-152]mm · 8 of 53 slices shown, 10 images]
[im 6/53  vessel]
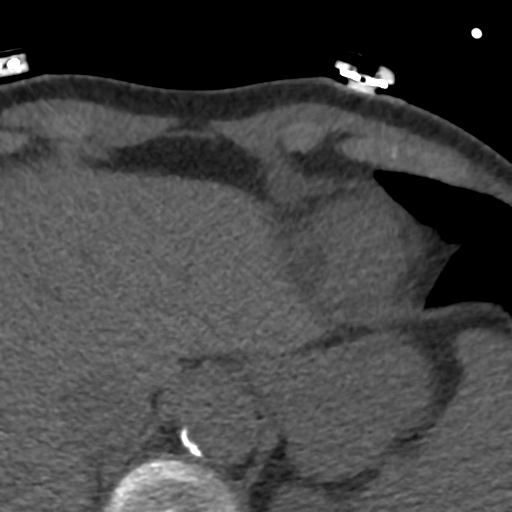
[im 6/53  lung]
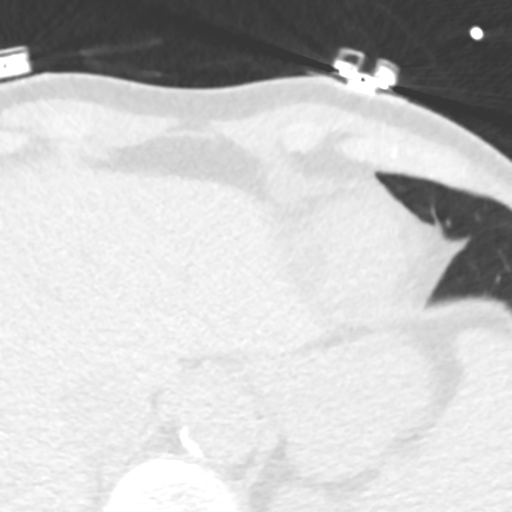
[im 12/53  vessel]
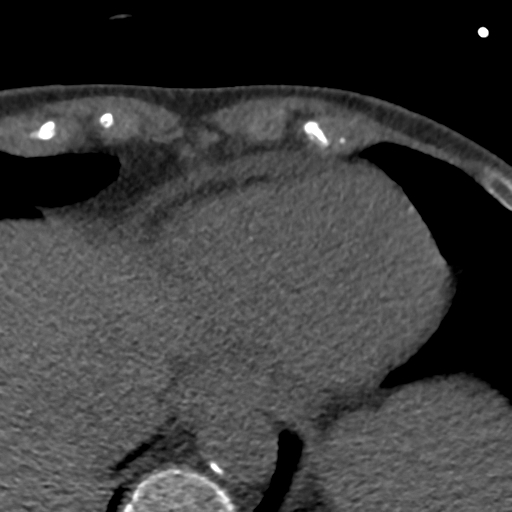
[im 18/53  vessel]
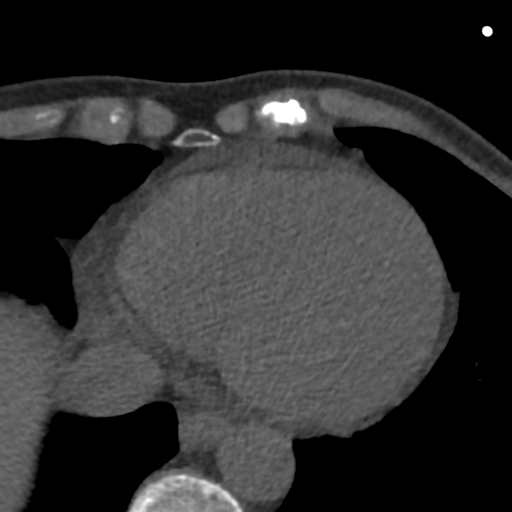
[im 24/53  vessel]
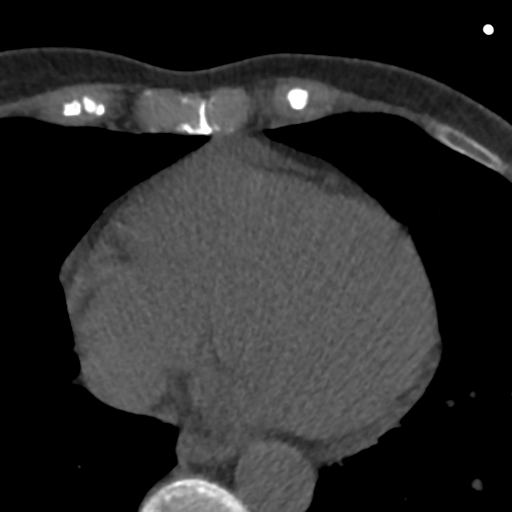
[im 29/53  vessel]
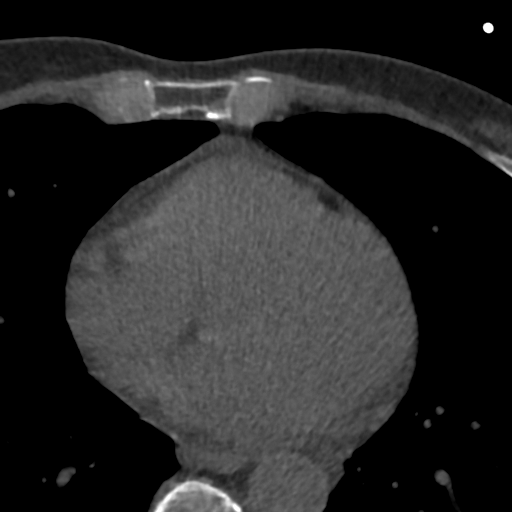
[im 29/53  lung]
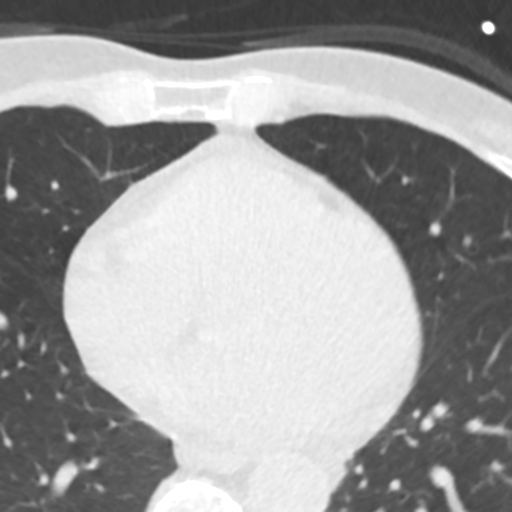
[im 35/53  vessel]
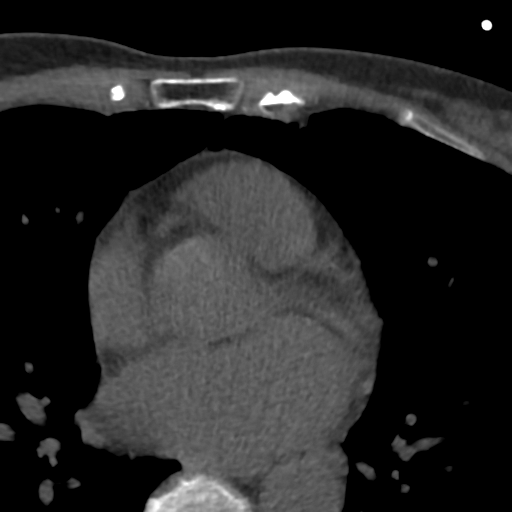
[im 41/53  vessel]
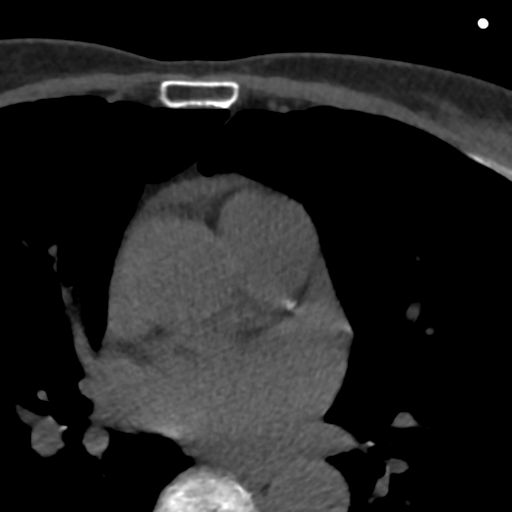
[im 47/53  vessel]
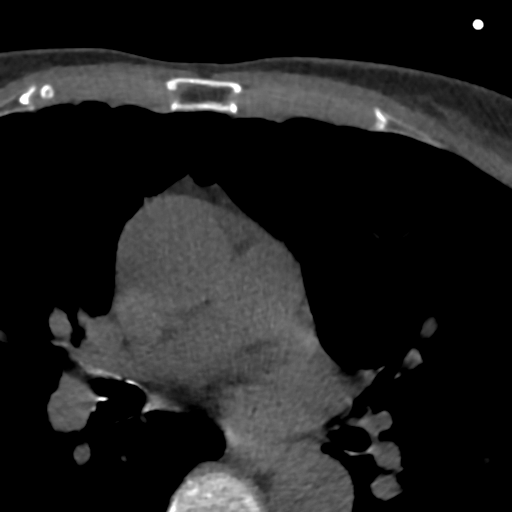

[Series 4: lung st 74 % · axial · 0.56mm/px · z∈[-275,-152]mm · 8 of 53 slices shown]
[im 6/53  lung]
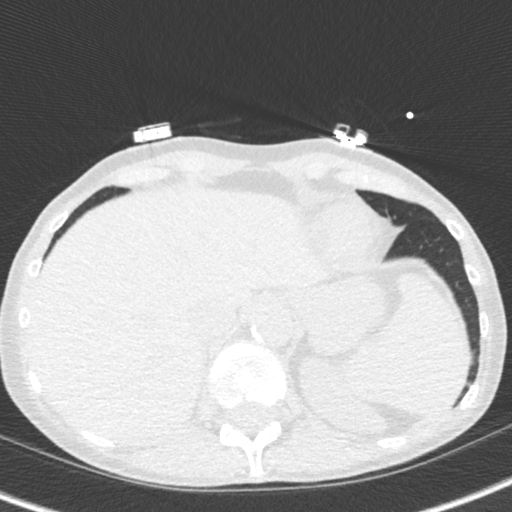
[im 12/53  lung]
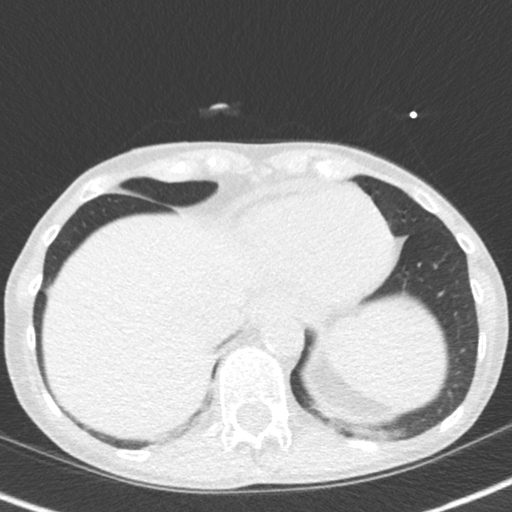
[im 18/53  lung]
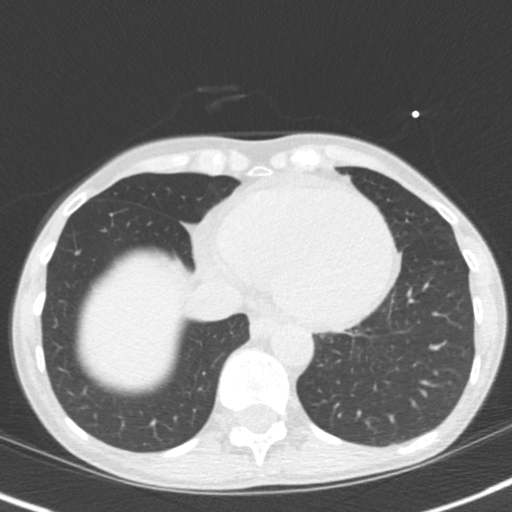
[im 24/53  lung]
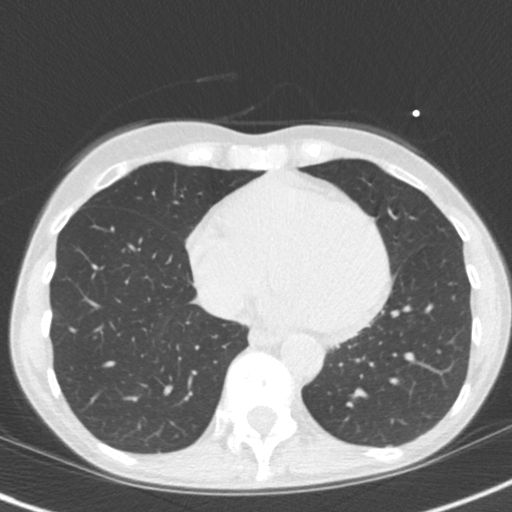
[im 29/53  lung]
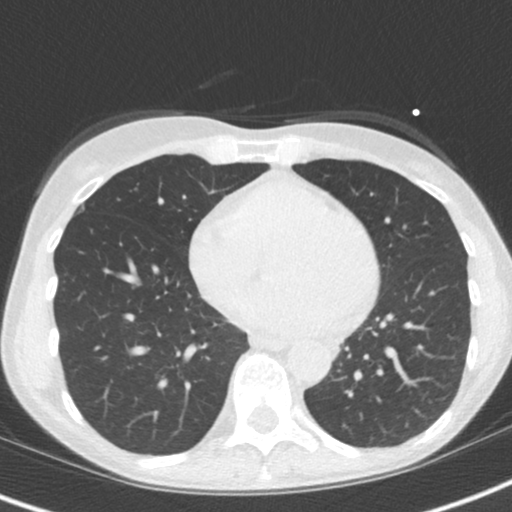
[im 35/53  lung]
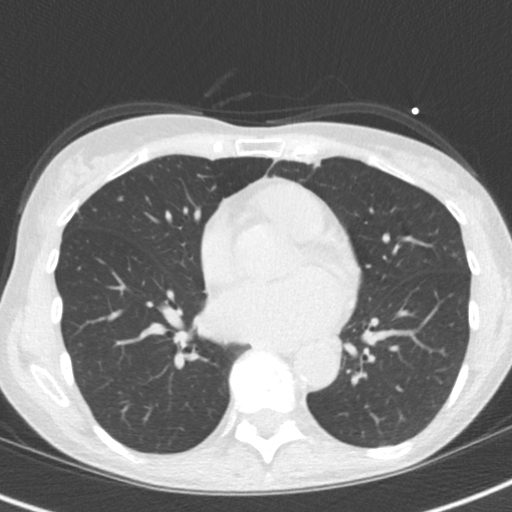
[im 41/53  lung]
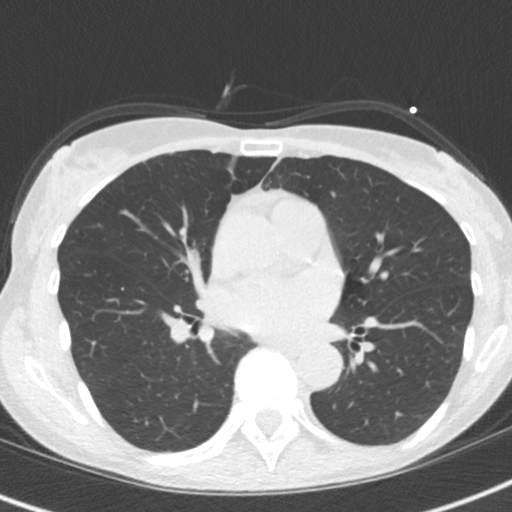
[im 47/53  lung]
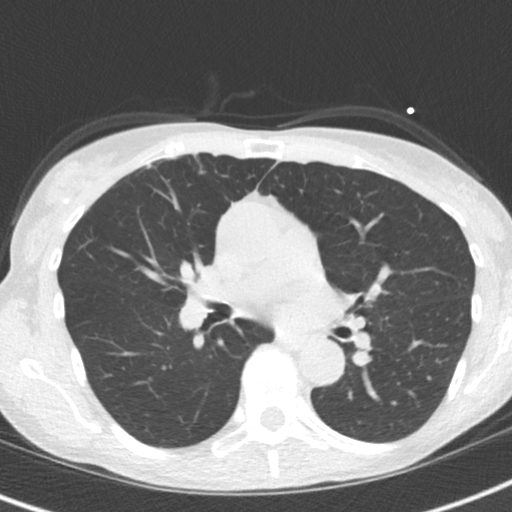

[16 of 20 positions shown; findings below may reference images not displayed]

FINDINGS: Non-cardiac: See separate report from [REDACTED].

Ascending Aorta: Normal diameter 3.3 cm

Pericardium: Normal

Coronary arteries: Calcium noted in LAD and circumflex
IMPRESSION: Coronary calcium score of 183 . This was 80 th percentile for age
and sex matched control.

Tiekie Tyabule

EXAM:
OVER-READ INTERPRETATION  CT CHEST

The following report is an over-read performed by radiologist Dr.
Per-Olov Maluenda [REDACTED] on 10/12/2017. This
over-read does not include interpretation of cardiac or coronary
anatomy or pathology. The coronary calcium score interpretation by
the cardiologist is attached.
FINDINGS: Aortic atherosclerosis. Mild subpleural reticulation in the anterior
aspect of the right upper and middle lobes deep to the right breast,
presumably postradiation changes. Within the visualized portions of
the thorax there are no suspicious appearing pulmonary nodules or
masses, there is no acute consolidative airspace disease, no pleural
effusions, no pneumothorax and no lymphadenopathy. Visualized
portions of the upper abdomen are unremarkable. There are no
aggressive appearing lytic or blastic lesions noted in the
visualized portions of the skeleton. Surgical clips in the right
breast likely from prior lumpectomy.
IMPRESSION: 1.  Aortic Atherosclerosis ([id number].D).

## 2020-04-27 LAB — EXTERNAL GENERIC LAB PROCEDURE: COLOGUARD: NEGATIVE

## 2020-04-27 LAB — COLOGUARD: COLOGUARD: NEGATIVE

## 2020-04-28 LAB — COLOGUARD

## 2020-05-08 ENCOUNTER — Other Ambulatory Visit: Payer: Self-pay | Admitting: Internal Medicine

## 2020-05-08 DIAGNOSIS — Z0001 Encounter for general adult medical examination with abnormal findings: Secondary | ICD-10-CM

## 2020-05-08 DIAGNOSIS — M810 Age-related osteoporosis without current pathological fracture: Secondary | ICD-10-CM

## 2020-05-08 DIAGNOSIS — Z86000 Personal history of in-situ neoplasm of breast: Secondary | ICD-10-CM

## 2020-05-08 DIAGNOSIS — E782 Mixed hyperlipidemia: Secondary | ICD-10-CM

## 2020-05-08 DIAGNOSIS — Z79899 Other long term (current) drug therapy: Secondary | ICD-10-CM

## 2020-05-11 ENCOUNTER — Other Ambulatory Visit: Payer: Self-pay

## 2020-05-11 DIAGNOSIS — Z0001 Encounter for general adult medical examination with abnormal findings: Secondary | ICD-10-CM

## 2020-05-11 DIAGNOSIS — Z86000 Personal history of in-situ neoplasm of breast: Secondary | ICD-10-CM

## 2020-05-11 DIAGNOSIS — E782 Mixed hyperlipidemia: Secondary | ICD-10-CM

## 2020-05-11 DIAGNOSIS — M810 Age-related osteoporosis without current pathological fracture: Secondary | ICD-10-CM

## 2020-05-11 DIAGNOSIS — Z79899 Other long term (current) drug therapy: Secondary | ICD-10-CM

## 2020-05-11 MED ORDER — ROSUVASTATIN CALCIUM 10 MG PO TABS: 10.0000 mg | ORAL_TABLET | Freq: Every day | ORAL | 1 refills | Status: DC

## 2020-06-09 DIAGNOSIS — H04123 Dry eye syndrome of bilateral lacrimal glands: Secondary | ICD-10-CM | POA: Diagnosis not present

## 2020-06-09 DIAGNOSIS — H524 Presbyopia: Secondary | ICD-10-CM | POA: Diagnosis not present

## 2020-06-09 DIAGNOSIS — H1045 Other chronic allergic conjunctivitis: Secondary | ICD-10-CM | POA: Diagnosis not present

## 2020-06-09 DIAGNOSIS — H2513 Age-related nuclear cataract, bilateral: Secondary | ICD-10-CM | POA: Diagnosis not present

## 2020-06-09 DIAGNOSIS — H52223 Regular astigmatism, bilateral: Secondary | ICD-10-CM | POA: Diagnosis not present

## 2020-06-09 DIAGNOSIS — H5203 Hypermetropia, bilateral: Secondary | ICD-10-CM | POA: Diagnosis not present

## 2020-06-10 DIAGNOSIS — Z1231 Encounter for screening mammogram for malignant neoplasm of breast: Secondary | ICD-10-CM | POA: Diagnosis not present

## 2020-06-29 ENCOUNTER — Telehealth: Payer: Self-pay | Admitting: Cardiovascular Disease

## 2020-06-29 NOTE — Telephone Encounter (Signed)
Patient has been contacted at least 3 times for a recall, recall has been deleted  

## 2020-07-07 DIAGNOSIS — E559 Vitamin D deficiency, unspecified: Secondary | ICD-10-CM | POA: Diagnosis not present

## 2020-07-07 DIAGNOSIS — M810 Age-related osteoporosis without current pathological fracture: Secondary | ICD-10-CM | POA: Diagnosis not present

## 2020-08-06 DIAGNOSIS — M810 Age-related osteoporosis without current pathological fracture: Secondary | ICD-10-CM | POA: Diagnosis not present

## 2020-10-06 DIAGNOSIS — H52223 Regular astigmatism, bilateral: Secondary | ICD-10-CM | POA: Diagnosis not present

## 2020-10-06 DIAGNOSIS — H04123 Dry eye syndrome of bilateral lacrimal glands: Secondary | ICD-10-CM | POA: Diagnosis not present

## 2020-10-06 DIAGNOSIS — H2513 Age-related nuclear cataract, bilateral: Secondary | ICD-10-CM | POA: Diagnosis not present

## 2020-10-06 DIAGNOSIS — H5203 Hypermetropia, bilateral: Secondary | ICD-10-CM | POA: Diagnosis not present

## 2020-10-06 DIAGNOSIS — H524 Presbyopia: Secondary | ICD-10-CM | POA: Diagnosis not present

## 2020-11-13 DIAGNOSIS — H25013 Cortical age-related cataract, bilateral: Secondary | ICD-10-CM | POA: Diagnosis not present

## 2020-11-13 DIAGNOSIS — H18413 Arcus senilis, bilateral: Secondary | ICD-10-CM | POA: Diagnosis not present

## 2020-11-13 DIAGNOSIS — H2512 Age-related nuclear cataract, left eye: Secondary | ICD-10-CM | POA: Diagnosis not present

## 2020-11-13 DIAGNOSIS — H2513 Age-related nuclear cataract, bilateral: Secondary | ICD-10-CM | POA: Diagnosis not present

## 2020-11-13 DIAGNOSIS — H25043 Posterior subcapsular polar age-related cataract, bilateral: Secondary | ICD-10-CM | POA: Diagnosis not present

## 2020-12-11 DIAGNOSIS — H25011 Cortical age-related cataract, right eye: Secondary | ICD-10-CM | POA: Diagnosis not present

## 2020-12-11 DIAGNOSIS — H2512 Age-related nuclear cataract, left eye: Secondary | ICD-10-CM | POA: Diagnosis not present

## 2020-12-11 DIAGNOSIS — H2511 Age-related nuclear cataract, right eye: Secondary | ICD-10-CM | POA: Diagnosis not present

## 2020-12-11 DIAGNOSIS — H25012 Cortical age-related cataract, left eye: Secondary | ICD-10-CM | POA: Diagnosis not present

## 2020-12-11 DIAGNOSIS — H25041 Posterior subcapsular polar age-related cataract, right eye: Secondary | ICD-10-CM | POA: Diagnosis not present

## 2021-01-08 DIAGNOSIS — H25011 Cortical age-related cataract, right eye: Secondary | ICD-10-CM | POA: Diagnosis not present

## 2021-01-08 DIAGNOSIS — H2511 Age-related nuclear cataract, right eye: Secondary | ICD-10-CM | POA: Diagnosis not present

## 2021-02-12 ENCOUNTER — Encounter: Payer: Self-pay | Admitting: Nurse Practitioner

## 2021-02-12 ENCOUNTER — Telehealth (INDEPENDENT_AMBULATORY_CARE_PROVIDER_SITE_OTHER): Payer: Medicare Other | Admitting: Nurse Practitioner

## 2021-02-12 ENCOUNTER — Other Ambulatory Visit: Payer: Self-pay

## 2021-02-12 VITALS — Temp 99.5°F | Ht 65.5 in | Wt 120.0 lb

## 2021-02-12 DIAGNOSIS — E782 Mixed hyperlipidemia: Secondary | ICD-10-CM | POA: Diagnosis not present

## 2021-02-12 DIAGNOSIS — R051 Acute cough: Secondary | ICD-10-CM

## 2021-02-12 DIAGNOSIS — U071 COVID-19: Secondary | ICD-10-CM

## 2021-02-12 DIAGNOSIS — J069 Acute upper respiratory infection, unspecified: Secondary | ICD-10-CM

## 2021-02-12 MED ORDER — NIRMATRELVIR/RITONAVIR (PAXLOVID)TABLET: 3.0000 | ORAL_TABLET | Freq: Two times a day (BID) | ORAL | 0 refills | Status: AC

## 2021-02-12 MED ORDER — ROSUVASTATIN CALCIUM 10 MG PO TABS: 10.0000 mg | ORAL_TABLET | Freq: Every day | ORAL | 1 refills | Status: DC

## 2021-02-12 MED ORDER — BENZONATATE 100 MG PO CAPS: 100.0000 mg | ORAL_CAPSULE | Freq: Two times a day (BID) | ORAL | 0 refills | Status: DC | PRN

## 2021-02-12 NOTE — Progress Notes (Signed)
[facility] [place], [place] [address]  Internal MEDICINE  Telephone Visit  Patient Name: Maria Gonzalez  [id number]  [id number]  Date of Service: 02/12/2021  I connected with the patient at 9:25 AM by telephone and verified the patients identity using two identifiers. Call was done by audio telephone call, patient having technical difficulties with video. I discussed the limitations, risks, security and privacy concerns of performing an evaluation and management service by telephone and the availability of in person appointments. I also discussed with the patient that there may be a patient responsible charge related to the service.  The patient expressed understanding and agrees to proceed.    Chief Complaint  Patient presents with   Telephone Screen    [phone]   Telephone Assessment    Home covid test  positive    Sore Throat   Cough   Headache    HPI Corynne presents for a telehealth virtual visit for headache, cough, and sore throat. She previously tested for COVID and was negative but as requested to test again. She tested for COVID again and was positive this time. She reports low grade fever, fatigue, runny nose, nasal congestion, cough and headache.   Current Medication: Outpatient Encounter Medications as of 02/12/2021  Medication Sig Note   acetaminophen (TYLENOL) 325 MG tablet Take 325 mg by mouth. 03/21/2016: Received from: [place]: Take 325 mg by mouth. Frequency:PRN   Dosage:325   MG  Instructions:  Note:Dose: 325MG    aspirin EC 81 MG tablet Take 81 mg by mouth daily.    benzonatate (TESSALON) 100 MG capsule Take 1 capsule (100 mg total) by mouth 2 (two) times daily as needed for cough.    Calcium Carbonate-Vit D-Min (CALCIUM 1200 PO) Take by mouth.    Cyanocobalamin (VITAMIN B-12 CR) 1000 MCG TBCR Take by mouth. 03/21/2016: Received from: [place]: Take by mouth. Frequency:QD   Dosage:1000   MCG   Instructions:  Note:Dose: 1000MCG   Krill Oil 300 MG CAPS Take by mouth.    Magnesium 250 MG TABS Frequency:PRN   Dosage:0.0     Instructions:  Note:Dose: 1-2 TABS 03/21/2016: Received from: [facility]   Multiple Vitamin (MULTIVITAMIN) capsule Take 1 capsule by mouth daily.    Niacin (VITAMIN B-3 PO) Take by mouth.    nirmatrelvir/ritonavir EUA (PAXLOVID) 20 x 150 MG & 10 x 100MG  TABS Take 3 tablets by mouth 2 (two) times daily for 5 days.    Omega-3 Fatty Acids (FISH OIL) 1000 MG CAPS Take by mouth. 03/21/2016: Received from: [place]: Take by mouth. Frequency:QD   Dosage:0.0     Instructions:  Note:Dose: 800-200MG    vitamin E 400 UNIT capsule Take 400 Units by mouth daily.    [DISCONTINUED] rosuvastatin (CRESTOR) 10 MG tablet Take 1 tablet (10 mg total) by mouth daily.    rosuvastatin (CRESTOR) 10 MG tablet Take 1 tablet (10 mg total) by mouth daily.    No facility-administered encounter medications on file as of 02/12/2021.    Surgical History: Past Surgical History:  Procedure Laterality Date   calcification removal     right breast biopsy  2013   right breast calcification s/p radiation   TONSILLECTOMY AND ADENOIDECTOMY      Medical History: Past Medical History:  Diagnosis Date   Allergy    Heart murmur    History of chicken pox    Hyperlipidemia    Hypertension  Mitral valve prolapse    UTI (urinary tract infection)     Family History: Family History  Problem Relation Age of Onset   Hypertension Mother    Stroke Mother    Heart disease Father    Cervical cancer Maternal Grandmother 49   Stroke Maternal Grandfather    Colon cancer Neg Hx     Social History   Socioeconomic History   Marital status: Married    Spouse name: Not on file   Number of children: Not on file   Years of education: Not on file   Highest education level: Not on file  Occupational History   Not on file  Tobacco Use   Smoking status: Never   Smokeless  tobacco: Never  Substance and Sexual Activity   Alcohol use: No   Drug use: No   Sexual activity: Not on file  Other Topics Concern   Not on file  Social History Narrative   Lives in [place]   Married.   Cats.      Enjoys shopping, [place], [place].      Retired [employer] for [employer].      Horticulture degree.    Social Determinants of Health   Financial Resource Strain: Not on file  Food Insecurity: Not on file  Transportation Needs: Not on file  Physical Activity: Not on file  Stress: Not on file  Social Connections: Not on file  Intimate Partner Violence: Not on file      Review of Systems  Constitutional:  Positive for fatigue and fever. Negative for chills.  HENT:  Positive for postnasal drip, rhinorrhea and sore throat. Negative for congestion, ear pain, sinus pressure, sinus pain, sneezing and trouble swallowing.   Respiratory:  Positive for cough. Negative for chest tightness, shortness of breath and wheezing.   Cardiovascular: Negative.  Negative for chest pain and palpitations.  Gastrointestinal:  Positive for nausea. Negative for abdominal pain, constipation, diarrhea and vomiting.  Genitourinary:  Negative for flank pain.  Musculoskeletal: Negative.  Negative for myalgias.  Skin: Negative.  Negative for rash.  Neurological:  Positive for headaches. Negative for dizziness and light-headedness.  Psychiatric/Behavioral: Negative.     Vital Signs: Temp 99.5 F (37.5 C)   Ht 5' 5.5" (1.664 m)   Wt 120 lb (54.4 kg)   BMI 19.67 kg/m    Observation/Objective: She is alert and oriented, engages in conversation appropriately. She does not appear to be in any acute distress over audio call.     Assessment/Plan: 1. Upper respiratory tract infection due to 2019 novel coronavirus Paxlovid prescribed for positive covid infection, instructions explained to patient.  - nirmatrelvir/ritonavir EUA (PAXLOVID) 20 x 150 MG & 10 x 100MG  TABS; Take 3 tablets  by mouth 2 (two) times daily for 5 days.  Dispense: 30 tablet; Refill: 0  2. Acute cough Benzonatate prescribed for symptomatic relief of cough - benzonatate (TESSALON) 100 MG capsule; Take 1 capsule (100 mg total) by mouth 2 (two) times daily as needed for cough.  Dispense: 30 capsule; Refill: 0  3. Mixed hyperlipidemia Refill ordered as requested.  - rosuvastatin (CRESTOR) 10 MG tablet; Take 1 tablet (10 mg total) by mouth daily.  Dispense: 90 tablet; Refill: 1    General Counseling: Annye verbalizes understanding of the findings of today's phone visit and agrees with plan of treatment. I have discussed any further diagnostic evaluation that may be needed or ordered today. We also reviewed her medications today. she has been encouraged to  call the office with any questions or concerns that should arise related to todays visit.  Return if symptoms worsen or fail to improve. Please call clinic if no improvement or worsening of symptoms after completing antiviral course.    No orders of the defined types were placed in this encounter.   Meds ordered this encounter  Medications   nirmatrelvir/ritonavir EUA (PAXLOVID) 20 x 150 MG & 10 x 100MG  TABS    Sig: Take 3 tablets by mouth 2 (two) times daily for 5 days.    Dispense:  30 tablet    Refill:  0   benzonatate (TESSALON) 100 MG capsule    Sig: Take 1 capsule (100 mg total) by mouth 2 (two) times daily as needed for cough.    Dispense:  30 capsule    Refill:  0   rosuvastatin (CRESTOR) 10 MG tablet    Sig: Take 1 tablet (10 mg total) by mouth daily.    Dispense:  90 tablet    Refill:  1     Time spent:15 Minutes Time spent with patient included reviewing progress notes, labs, imaging studies, and discussing plan for follow up.  [place] Controlled Substance Database was reviewed by me for overdose risk score (ORS) if appropriate.  This patient was seen by Jonetta Osgood, FNP-C in collaboration with Dr. Clayborn Bigness as a part of  collaborative care agreement.  Emmelyn Schmale R. Valetta Fuller, MSN, FNP-C Internal medicine

## 2021-02-17 DIAGNOSIS — Z1152 Encounter for screening for COVID-19: Secondary | ICD-10-CM | POA: Diagnosis not present

## 2021-02-26 ENCOUNTER — Other Ambulatory Visit: Payer: Self-pay

## 2021-02-26 ENCOUNTER — Encounter: Payer: Self-pay | Admitting: Nurse Practitioner

## 2021-02-26 ENCOUNTER — Telehealth (INDEPENDENT_AMBULATORY_CARE_PROVIDER_SITE_OTHER): Payer: Medicare Other | Admitting: Nurse Practitioner

## 2021-02-26 VITALS — Temp 97.4°F | Resp 16 | Ht 65.5 in | Wt 120.0 lb

## 2021-02-26 DIAGNOSIS — R0982 Postnasal drip: Secondary | ICD-10-CM | POA: Diagnosis not present

## 2021-02-26 DIAGNOSIS — R052 Subacute cough: Secondary | ICD-10-CM

## 2021-02-26 MED ORDER — MONTELUKAST SODIUM 10 MG PO TABS: 10.0000 mg | ORAL_TABLET | Freq: Every day | ORAL | 1 refills | Status: DC

## 2021-02-26 MED ORDER — PROMETHAZINE-PHENYLEPHRINE 6.25-5 MG/5ML PO SYRP: 5.0000 mL | ORAL_SOLUTION | ORAL | 0 refills | Status: DC | PRN

## 2021-02-26 NOTE — Progress Notes (Signed)
[facility] [place], [place] [address]  Internal MEDICINE  Telephone Visit  Patient Name: Maria Gonzalez  [id number]  [id number]  Date of Service: 02/26/2021  I connected with the patient at 9:10 AM by telephone and verified the patients identity using two identifiers.   I discussed the limitations, risks, security and privacy concerns of performing an evaluation and management service by telephone and the availability of in person appointments. I also discussed with the patient that there may be a patient responsible charge related to the service.  The patient expressed understanding and agrees to proceed.    Chief Complaint  Patient presents with   Acute Visit    Cough disrupts sleep, congestion, is doing a nose wash, has nasal drainage, had covid 02-12-21   Telephone Assessment    [phone]    Telephone Screen    Phone call   Cough   Sinusitis    Cough Associated symptoms include postnasal drip and rhinorrhea. Pertinent negatives include no chest pain, chills, fever, headaches, myalgias, rash, sore throat, shortness of breath or wheezing.  Sinusitis Associated symptoms include congestion, coughing and sneezing. Pertinent negatives include no chills, headaches, shortness of breath, sinus pressure or sore throat.  Maria Gonzalez presents for a telehealth virtual visit for   Current Medication: Outpatient Encounter Medications as of 02/26/2021  Medication Sig Note   acetaminophen (TYLENOL) 325 MG tablet Take 325 mg by mouth. 03/21/2016: Received from: [place]: Take 325 mg by mouth. Frequency:PRN   Dosage:325   MG  Instructions:  Note:Dose: 325MG    Ascorbic Acid (VITAMIN C) POWD Take by mouth.    aspirin EC 81 MG tablet Take 81 mg by mouth daily.    benzonatate (TESSALON) 100 MG capsule Take 1 capsule (100 mg total) by mouth 2 (two) times daily as needed for cough.    Calcium Carbonate-Vit D-Min (CALCIUM 1200 PO) Take by mouth.     Cyanocobalamin (VITAMIN B-12 CR) 1000 MCG TBCR Take by mouth. 03/21/2016: Received from: [place]: Take by mouth. Frequency:QD   Dosage:1000   MCG  Instructions:  Note:Dose: 1000MCG   Krill Oil 300 MG CAPS Take by mouth.    Magnesium 250 MG TABS Frequency:PRN   Dosage:0.0     Instructions:  Note:Dose: 1-2 TABS 03/21/2016: Received from: [facility]   montelukast (SINGULAIR) 10 MG tablet Take 1 tablet (10 mg total) by mouth at bedtime.    Multiple Vitamin (MULTIVITAMIN) capsule Take 1 capsule by mouth daily.    Multiple Vitamins-Minerals (ZINC PO) Take by mouth.    Niacin (VITAMIN B-3 PO) Take by mouth.    Omega-3 Fatty Acids (FISH OIL) 1000 MG CAPS Take by mouth. 03/21/2016: Received from: [place]: Take by mouth. Frequency:QD   Dosage:0.0     Instructions:  Note:Dose: 800-200MG    promethazine-phenylephrine 6.25-5 MG/5ML SYRP Take 5 mLs by mouth every 4 (four) hours as needed for congestion.    rosuvastatin (CRESTOR) 10 MG tablet Take 1 tablet (10 mg total) by mouth daily.    vitamin E 400 UNIT capsule Take 400 Units by mouth daily.    No facility-administered encounter medications on file as of 02/26/2021.    Surgical History: Past Surgical History:  Procedure Laterality Date   calcification removal     CATARACT EXTRACTION     right breast biopsy  2013   right breast calcification s/p radiation   TONSILLECTOMY AND ADENOIDECTOMY  Medical History: Past Medical History:  Diagnosis Date   Allergy    Heart murmur    History of chicken pox    Hyperlipidemia    Hypertension    Mitral valve prolapse    UTI (urinary tract infection)     Family History: Family History  Problem Relation Age of Onset   Hypertension Mother    Stroke Mother    Heart disease Father    Cervical cancer Maternal Grandmother 25   Stroke Maternal Grandfather    Colon cancer Neg Hx     Social History   Socioeconomic History   Marital status: Married     Spouse name: Not on file   Number of children: Not on file   Years of education: Not on file   Highest education level: Not on file  Occupational History   Not on file  Tobacco Use   Smoking status: Never   Smokeless tobacco: Never  Substance and Sexual Activity   Alcohol use: No   Drug use: No   Sexual activity: Not on file  Other Topics Concern   Not on file  Social History Narrative   Lives in [place]   Married.   Cats.      Enjoys shopping, [place], [place].      Retired [employer] for [employer].      Horticulture degree.    Social Determinants of Health   Financial Resource Strain: Not on file  Food Insecurity: Not on file  Transportation Needs: Not on file  Physical Activity: Not on file  Stress: Not on file  Social Connections: Not on file  Intimate Partner Violence: Not on file      Review of Systems  Constitutional:  Negative for chills, fatigue and fever.  HENT:  Positive for congestion, postnasal drip, rhinorrhea and sneezing. Negative for sinus pressure, sinus pain and sore throat.   Respiratory:  Positive for cough. Negative for chest tightness, shortness of breath and wheezing.   Cardiovascular: Negative.  Negative for chest pain and palpitations.  Musculoskeletal:  Negative for myalgias.  Skin:  Negative for rash.  Neurological:  Negative for headaches.   Vital Signs: Temp (!) 97.4 F (36.3 C)   Resp 16   Ht 5' 5.5" (1.664 m)   Wt 120 lb (54.4 kg)   BMI 19.67 kg/m    Observation/Objective: She is alert and oriented and engages in conversation appropriately. She does not sound like she is in any acute distress over telephone call.     Assessment/Plan: 1. Postnasal drip Montelukast prescribed to decrease drainage. Cough syrup that was prescribed may also help with congestion and drainage.  - promethazine-phenylephrine 6.25-5 MG/5ML SYRP; Take 5 mLs by mouth every 4 (four) hours as needed for congestion.  Dispense: 180 mL; Refill:  0 - montelukast (SINGULAIR) 10 MG tablet; Take 1 tablet (10 mg total) by mouth at bedtime.  Dispense: 30 tablet; Refill: 1  2. Subacute cough Cough syrup prescribed, benzonatate is no longer helping - promethazine-phenylephrine 6.25-5 MG/5ML SYRP; Take 5 mLs by mouth every 4 (four) hours as needed for congestion.  Dispense: 180 mL; Refill: 0   General Counseling: Swati verbalizes understanding of the findings of today's phone visit and agrees with plan of treatment. I have discussed any further diagnostic evaluation that may be needed or ordered today. We also reviewed her medications today. she has been encouraged to call the office with any questions or concerns that should arise related to todays visit.  Return  if symptoms worsen or fail to improve.   No orders of the defined types were placed in this encounter.   Meds ordered this encounter  Medications   promethazine-phenylephrine 6.25-5 MG/5ML SYRP    Sig: Take 5 mLs by mouth every 4 (four) hours as needed for congestion.    Dispense:  180 mL    Refill:  0   montelukast (SINGULAIR) 10 MG tablet    Sig: Take 1 tablet (10 mg total) by mouth at bedtime.    Dispense:  30 tablet    Refill:  1    Time spent:10 Minutes Time spent with patient included reviewing progress notes, labs, imaging studies, and discussing plan for follow up.  [place] Controlled Substance Database was reviewed by me for overdose risk score (ORS) if appropriate.  This patient was seen by Jonetta Osgood, FNP-C in collaboration with Dr. Clayborn Bigness as a part of collaborative care agreement.  Wasim Hurlbut R. Valetta Fuller, MSN, FNP-C Internal medicine

## 2021-03-20 ENCOUNTER — Other Ambulatory Visit: Payer: Self-pay | Admitting: Nurse Practitioner

## 2021-03-20 DIAGNOSIS — R0982 Postnasal drip: Secondary | ICD-10-CM

## 2021-03-30 ENCOUNTER — Ambulatory Visit: Payer: PPO | Admitting: Internal Medicine

## 2021-03-31 ENCOUNTER — Ambulatory Visit: Payer: Medicare Other | Admitting: Internal Medicine

## 2021-04-04 ENCOUNTER — Other Ambulatory Visit: Payer: Self-pay | Admitting: Nurse Practitioner

## 2021-04-04 DIAGNOSIS — R0982 Postnasal drip: Secondary | ICD-10-CM

## 2021-04-06 ENCOUNTER — Ambulatory Visit: Payer: PPO | Admitting: Internal Medicine

## 2021-04-13 ENCOUNTER — Ambulatory Visit (INDEPENDENT_AMBULATORY_CARE_PROVIDER_SITE_OTHER): Payer: Medicare Other | Admitting: Internal Medicine

## 2021-04-13 ENCOUNTER — Encounter: Payer: Self-pay | Admitting: Internal Medicine

## 2021-04-13 ENCOUNTER — Other Ambulatory Visit: Payer: Self-pay

## 2021-04-13 VITALS — BP 164/64 | HR 97 | Temp 98.3°F | Resp 16 | Ht 65.0 in | Wt 123.0 lb

## 2021-04-13 DIAGNOSIS — E782 Mixed hyperlipidemia: Secondary | ICD-10-CM | POA: Diagnosis not present

## 2021-04-13 DIAGNOSIS — Z0001 Encounter for general adult medical examination with abnormal findings: Secondary | ICD-10-CM

## 2021-04-13 DIAGNOSIS — Z23 Encounter for immunization: Secondary | ICD-10-CM

## 2021-04-13 DIAGNOSIS — R300 Dysuria: Secondary | ICD-10-CM

## 2021-04-13 DIAGNOSIS — R030 Elevated blood-pressure reading, without diagnosis of hypertension: Secondary | ICD-10-CM

## 2021-04-13 DIAGNOSIS — M810 Age-related osteoporosis without current pathological fracture: Secondary | ICD-10-CM

## 2021-04-13 DIAGNOSIS — R5383 Other fatigue: Secondary | ICD-10-CM

## 2021-04-13 DIAGNOSIS — F411 Generalized anxiety disorder: Secondary | ICD-10-CM | POA: Diagnosis not present

## 2021-04-13 MED ORDER — TETANUS-DIPHTH-ACELL PERTUSSIS 5-2.5-18.5 LF-MCG/0.5 IM SUSP: INTRAMUSCULAR | 0 refills | Status: DC

## 2021-04-13 MED ORDER — ZOLPIDEM TARTRATE 5 MG PO TABS
5.0000 mg | ORAL_TABLET | Freq: Every evening | ORAL | 1 refills | Status: DC | PRN
Start: 2021-04-13 — End: 2022-04-18

## 2021-04-13 MED ORDER — PNEUMOCOCCAL 20-VAL CONJ VACC 0.5 ML IM SUSY: PREFILLED_SYRINGE | INTRAMUSCULAR | 0 refills | Status: DC

## 2021-04-13 NOTE — Progress Notes (Signed)
[facility] [place], [place] [address]  Internal MEDICINE  Office Visit Note  Patient Name: Maria Gonzalez  [id number]  [id number]  Date of Service: 04/13/2021  Chief Complaint  Patient presents with   Medicare Wellness     HPI Pt is here for routine health maintenance examination - Having problem sleeping, feels anxious at night however does not want to start any medications on a regular basis however would like to try on a as needed basis -Patient continues to be on Crestor for atherosclerosis has tolerated well. -She also has osteoporosis and is followed by endocrinology -She lives with her husband, active, likes to cook at home -We will update some of her vaccination as indicate - BP is slightly elevated  Current Medication: Outpatient Encounter Medications as of 04/13/2021  Medication Sig Note   acetaminophen (TYLENOL) 325 MG tablet Take 325 mg by mouth. 03/21/2016: Received from: [place]: Take 325 mg by mouth. Frequency:PRN   Dosage:325   MG  Instructions:  Note:Dose: 325MG    aspirin EC 81 MG tablet Take 81 mg by mouth daily.    Calcium Carbonate-Vit D-Min (CALCIUM 1200 PO) Take by mouth.    Coenzyme Q10 (CO Q 10 PO) Take by mouth.    Multiple Vitamin (MULTIVITAMIN) capsule Take 1 capsule by mouth daily.    Multiple Vitamins-Minerals (ZINC PO) Take by mouth.    pneumococcal 20-valent conjugate vaccine (PREVNAR 20) 0.5 ML injection Use as directed    rosuvastatin (CRESTOR) 10 MG tablet Take 1 tablet (10 mg total) by mouth daily.    zolpidem (AMBIEN) 5 MG tablet Take 1 tablet (5 mg total) by mouth at bedtime as needed for sleep.    [DISCONTINUED] PNEUMOCOCCAL 20-VAL CONJ VACC IM Inject into the muscle.    [DISCONTINUED] Tdap (BOOSTRIX) 5-2.5-18.5 LF-MCG/0.5 injection Inject 0.5 mLs into the muscle once.    Tdap (BOOSTRIX) 5-2.5-18.5 LF-MCG/0.5 injection Use as directed    [DISCONTINUED] Ascorbic Acid (VITAMIN C) POWD Take by  mouth. (Patient not taking: Reported on 04/13/2021)    [DISCONTINUED] benzonatate (TESSALON) 100 MG capsule Take 1 capsule (100 mg total) by mouth 2 (two) times daily as needed for cough. (Patient not taking: Reported on 04/13/2021)    [DISCONTINUED] Cyanocobalamin (VITAMIN B-12 CR) 1000 MCG TBCR Take by mouth. (Patient not taking: Reported on 04/13/2021) 03/21/2016: Received from: [place]: Take by mouth. Frequency:QD   Dosage:1000   MCG  Instructions:  Note:Dose: 1000MCG   [DISCONTINUED] Krill Oil 300 MG CAPS Take by mouth. (Patient not taking: Reported on 04/13/2021)    [DISCONTINUED] Magnesium 250 MG TABS Frequency:PRN   Dosage:0.0     Instructions:  Note:Dose: 1-2 TABS (Patient not taking: Reported on 04/13/2021) 03/21/2016: Received from: [facility]   [DISCONTINUED] montelukast (SINGULAIR) 10 MG tablet TAKE 1 TABLET BY MOUTH EVERYDAY AT BEDTIME (Patient not taking: Reported on 04/13/2021)    [DISCONTINUED] Niacin (VITAMIN B-3 PO) Take by mouth. (Patient not taking: Reported on 04/13/2021)    [DISCONTINUED] Omega-3 Fatty Acids (FISH OIL) 1000 MG CAPS Take by mouth. (Patient not taking: Reported on 04/13/2021) 03/21/2016: Received from: [place]: Take by mouth. Frequency:QD   Dosage:0.0     Instructions:  Note:Dose: 800-200MG    [DISCONTINUED] promethazine-phenylephrine 6.25-5 MG/5ML SYRP Take 5 mLs by mouth every 4 (four) hours as needed for congestion. (Patient not taking: Reported on 04/13/2021)    [DISCONTINUED] vitamin E 400 UNIT capsule Take 400 Units by mouth  daily. (Patient not taking: Reported on 04/13/2021)    No facility-administered encounter medications on file as of 04/13/2021.    Surgical History: Past Surgical History:  Procedure Laterality Date   calcification removal     CATARACT EXTRACTION     right breast biopsy  2013   right breast calcification s/p radiation   TONSILLECTOMY AND ADENOIDECTOMY      Medical History: Past Medical  History:  Diagnosis Date   Allergy    Heart murmur    History of chicken pox    Hyperlipidemia    Hypertension    Mitral valve prolapse    UTI (urinary tract infection)     Family History: Family History  Problem Relation Age of Onset   Hypertension Mother    Stroke Mother    Heart disease Father    Cervical cancer Maternal Grandmother 38   Stroke Maternal Grandfather    Colon cancer Neg Hx     Social History: Social History   Socioeconomic History   Marital status: Married    Spouse name: Not on file   Number of children: Not on file   Years of education: Not on file   Highest education level: Not on file  Occupational History   Not on file  Tobacco Use   Smoking status: Never   Smokeless tobacco: Never  Substance and Sexual Activity   Alcohol use: No   Drug use: No   Sexual activity: Not on file  Other Topics Concern   Not on file  Social History Narrative   Lives in [place]   Married.   Cats.      Enjoys shopping, [place], [place].      Retired [employer] for [employer].      Horticulture degree.    Social Determinants of Health   Financial Resource Strain: Not on file  Food Insecurity: Not on file  Transportation Needs: Not on file  Physical Activity: Not on file  Stress: Not on file  Social Connections: Not on file      Review of Systems  Constitutional:  Negative for chills, fatigue and unexpected weight change.  HENT:  Positive for postnasal drip. Negative for congestion, rhinorrhea, sneezing and sore throat.   Eyes:  Negative for redness.  Respiratory:  Negative for cough, chest tightness and shortness of breath.   Cardiovascular:  Negative for chest pain and palpitations.  Gastrointestinal:  Negative for abdominal pain, constipation, diarrhea, nausea and vomiting.  Genitourinary:  Negative for dysuria and frequency.  Musculoskeletal:  Negative for arthralgias, back pain, joint swelling and neck pain.  Skin:  Negative for rash.   Neurological: Negative.  Negative for tremors and numbness.  Hematological:  Negative for adenopathy. Does not bruise/bleed easily.  Psychiatric/Behavioral:  Positive for sleep disturbance. Negative for behavioral problems (Depression) and suicidal ideas. The patient is not nervous/anxious.     Vital Signs: BP (!) 164/64   Pulse 97   Temp 98.3 F (36.8 C)   Resp 16   Ht 5\' 5"  (1.651 m)   Wt 123 lb (55.8 kg)   SpO2 97%   BMI 20.47 kg/m    Physical Exam Constitutional:      General: She is not in acute distress.    Appearance: She is well-developed. She is not diaphoretic.  HENT:     Head: Normocephalic and atraumatic.     Right Ear: External ear normal.     Left Ear: External ear normal.     Nose: Nose  normal.     Mouth/Throat:     Pharynx: No oropharyngeal exudate.  Eyes:     General: No scleral icterus.       Right eye: No discharge.        Left eye: No discharge.     Conjunctiva/sclera: Conjunctivae normal.     Pupils: Pupils are equal, round, and reactive to light.  Neck:     Thyroid: No thyromegaly.     Vascular: No JVD.     Trachea: No tracheal deviation.  Cardiovascular:     Rate and Rhythm: Normal rate and regular rhythm.     Heart sounds: Normal heart sounds. No murmur heard.   No friction rub. No gallop.  Pulmonary:     Effort: Pulmonary effort is normal. No respiratory distress.     Breath sounds: Normal breath sounds. No stridor. No wheezing or rales.  Chest:     Chest wall: No tenderness.  Abdominal:     General: Bowel sounds are normal. There is no distension.     Palpations: Abdomen is soft. There is no mass.     Tenderness: There is no abdominal tenderness. There is no guarding or rebound.  Musculoskeletal:        General: No tenderness or deformity. Normal range of motion.     Cervical back: Normal range of motion and neck supple.  Lymphadenopathy:     Cervical: No cervical adenopathy.  Skin:    General: Skin is warm and dry.      Coloration: Skin is not pale.     Findings: No erythema or rash.  Neurological:     Mental Status: She is alert.     Cranial Nerves: No cranial nerve deficit.     Motor: No abnormal muscle tone.     Coordination: Coordination normal.     Deep Tendon Reflexes: Reflexes are normal and symmetric.  Psychiatric:        Behavior: Behavior normal.        Thought Content: Thought content normal.        Judgment: Judgment normal.    Assessment/Plan: 1. Encounter for general adult medical examination with abnormal findings Patient is current on her preventive health maintenance including mammogram, colonoscopy and bone density - TSH - T4, free - Comprehensive metabolic panel  2. Age related osteoporosis, unspecified pathological fracture presence She does have osteoporosis/osteopenia, continue calcium and vitamin D, oral biphosphonate's have not been tolerated by the patient - CBC with Differential/Platelet - Lipid Panel With LDL/HDL Ratio - TSH - T4, free - Comprehensive metabolic panel  3. Mixed hyperlipidemia Continue Crestor as before patient did have calcium scoring CT done and was abnormal she was started on Crestor at that time - Lipid Panel With LDL/HDL Ratio - Comprehensive metabolic panel  4. Generalized anxiety disorder Low-dose Ambien as needed - zolpidem (AMBIEN) 5 MG tablet; Take 1 tablet (5 mg total) by mouth at bedtime as needed for sleep.  Dispense: 30 tablet; Refill: 1 - TSH - T4, free - Comprehensive metabolic panel  5. Elevated blood pressure reading Monitor blood pressure as before we will start therapy as indicated.  Blood pressure is 140/60  6. Other fatigue Check CBC and iron studies, I45 ad folic acid  - CBC with Differential/Platelet - TSH - T4, free  7. Dysuria - UA/M w/rflx Culture, Routine - Microscopic Examination  8. Need for vaccination - pneumococcal 20-valent conjugate vaccine (PREVNAR 20) 0.5 ML injection; Use as directed  Dispense: 0.5  mL;  Refill: 0 - Tdap (BOOSTRIX) 5-2.5-18.5 LF-MCG/0.5 injection; Use as directed  Dispense: 0.5 mL; Refill: 0   General Counseling: Maria Gonzalez verbalizes understanding of the findings of todays visit and agrees with plan of treatment. I have discussed any further diagnostic evaluation that may be needed or ordered today. We also reviewed her medications today. she has been encouraged to call the office with any questions or concerns that should arise related to todays visit.    Counseling:   Controlled Substance Database was reviewed by me.  No orders of the defined types were placed in this encounter.   Meds ordered this encounter  Medications   pneumococcal 20-valent conjugate vaccine (PREVNAR 20) 0.5 ML injection    Sig: Use as directed    Dispense:  0.5 mL    Refill:  0   Tdap (BOOSTRIX) 5-2.5-18.5 LF-MCG/0.5 injection    Sig: Use as directed    Dispense:  0.5 mL    Refill:  0   zolpidem (AMBIEN) 5 MG tablet    Sig: Take 1 tablet (5 mg total) by mouth at bedtime as needed for sleep.    Dispense:  30 tablet    Refill:  1    Total time spent:30 Minutes  Time spent includes review of chart, medications, test results, and follow up plan with the patient.     Lavera Guise, MD  Internal Medicine

## 2021-04-14 DIAGNOSIS — R5383 Other fatigue: Secondary | ICD-10-CM | POA: Diagnosis not present

## 2021-04-14 DIAGNOSIS — Z23 Encounter for immunization: Secondary | ICD-10-CM | POA: Diagnosis not present

## 2021-04-14 DIAGNOSIS — M810 Age-related osteoporosis without current pathological fracture: Secondary | ICD-10-CM | POA: Diagnosis not present

## 2021-04-14 DIAGNOSIS — F411 Generalized anxiety disorder: Secondary | ICD-10-CM | POA: Diagnosis not present

## 2021-04-14 DIAGNOSIS — E782 Mixed hyperlipidemia: Secondary | ICD-10-CM | POA: Diagnosis not present

## 2021-04-14 DIAGNOSIS — Z0001 Encounter for general adult medical examination with abnormal findings: Secondary | ICD-10-CM | POA: Diagnosis not present

## 2021-04-14 LAB — UA/M W/RFLX CULTURE, ROUTINE
Bilirubin, UA: NEGATIVE
Glucose, UA: NEGATIVE
Ketones, UA: NEGATIVE
Leukocytes,UA: NEGATIVE
Nitrite, UA: NEGATIVE
Protein,UA: NEGATIVE
RBC, UA: NEGATIVE
Specific Gravity, UA: 1.007 (ref 1.005–1.030)
Urobilinogen, Ur: 0.2 mg/dL (ref 0.2–1.0)
pH, UA: 7 (ref 5.0–7.5)

## 2021-04-14 LAB — MICROSCOPIC EXAMINATION
Bacteria, UA: NONE SEEN
Casts: NONE SEEN /lpf
Epithelial Cells (non renal): NONE SEEN /hpf (ref 0–10)
RBC, Urine: NONE SEEN /hpf (ref 0–2)
WBC, UA: NONE SEEN /hpf (ref 0–5)

## 2021-04-15 LAB — COMPREHENSIVE METABOLIC PANEL
ALT: 22 IU/L (ref 0–32)
AST: 34 IU/L (ref 0–40)
Albumin/Globulin Ratio: 2.7 — ABNORMAL HIGH (ref 1.2–2.2)
Albumin: 4.9 g/dL — ABNORMAL HIGH (ref 3.7–4.7)
Alkaline Phosphatase: 72 IU/L (ref 44–121)
BUN/Creatinine Ratio: 24 (ref 12–28)
BUN: 15 mg/dL (ref 8–27)
Bilirubin Total: 0.3 mg/dL (ref 0.0–1.2)
CO2: 27 mmol/L (ref 20–29)
Calcium: 9.4 mg/dL (ref 8.7–10.3)
Chloride: 104 mmol/L (ref 96–106)
Creatinine, Ser: 0.62 mg/dL (ref 0.57–1.00)
Globulin, Total: 1.8 g/dL (ref 1.5–4.5)
Glucose: 79 mg/dL (ref 70–99)
Potassium: 4 mmol/L (ref 3.5–5.2)
Sodium: 144 mmol/L (ref 134–144)
Total Protein: 6.7 g/dL (ref 6.0–8.5)
eGFR: 93 mL/min/{1.73_m2} (ref >59)

## 2021-04-15 LAB — CBC WITH DIFFERENTIAL/PLATELET
Basophils Absolute: 0 10*3/uL (ref 0.0–0.2)
Basos: 1 %
EOS (ABSOLUTE): 0.1 10*3/uL (ref 0.0–0.4)
Eos: 2 %
Hematocrit: 45 % (ref 34.0–46.6)
Hemoglobin: 14.8 g/dL (ref 11.1–15.9)
Immature Grans (Abs): 0 10*3/uL (ref 0.0–0.1)
Immature Granulocytes: 0 %
Lymphocytes Absolute: 1.3 10*3/uL (ref 0.7–3.1)
Lymphs: 21 %
MCH: 31.8 pg (ref 26.6–33.0)
MCHC: 32.9 g/dL (ref 31.5–35.7)
MCV: 97 fL (ref 79–97)
Monocytes Absolute: 0.5 10*3/uL (ref 0.1–0.9)
Monocytes: 8 %
Neutrophils Absolute: 4.1 10*3/uL (ref 1.4–7.0)
Neutrophils: 68 %
Platelets: 189 10*3/uL (ref 150–450)
RBC: 4.66 x10E6/uL (ref 3.77–5.28)
RDW: 12.5 % (ref 11.7–15.4)
WBC: 6 10*3/uL (ref 3.4–10.8)

## 2021-04-15 LAB — TSH: TSH: 1.64 u[IU]/mL (ref 0.450–4.500)

## 2021-04-15 LAB — LIPID PANEL WITH LDL/HDL RATIO
Cholesterol, Total: 166 mg/dL (ref 100–199)
HDL: 55 mg/dL (ref >39)
LDL Chol Calc (NIH): 95 mg/dL (ref 0–99)
LDL/HDL Ratio: 1.7 ratio (ref 0.0–3.2)
Triglycerides: 88 mg/dL (ref 0–149)
VLDL Cholesterol Cal: 16 mg/dL (ref 5–40)

## 2021-04-15 LAB — T4, FREE: Free T4: 1.19 ng/dL (ref 0.82–1.77)

## 2021-04-22 DIAGNOSIS — X32XXXA Exposure to sunlight, initial encounter: Secondary | ICD-10-CM | POA: Diagnosis not present

## 2021-04-22 DIAGNOSIS — D2271 Melanocytic nevi of right lower limb, including hip: Secondary | ICD-10-CM | POA: Diagnosis not present

## 2021-04-22 DIAGNOSIS — L821 Other seborrheic keratosis: Secondary | ICD-10-CM | POA: Diagnosis not present

## 2021-04-22 DIAGNOSIS — L570 Actinic keratosis: Secondary | ICD-10-CM | POA: Diagnosis not present

## 2021-04-22 DIAGNOSIS — D225 Melanocytic nevi of trunk: Secondary | ICD-10-CM | POA: Diagnosis not present

## 2021-04-22 DIAGNOSIS — D2262 Melanocytic nevi of left upper limb, including shoulder: Secondary | ICD-10-CM | POA: Diagnosis not present

## 2021-04-22 DIAGNOSIS — D2261 Melanocytic nevi of right upper limb, including shoulder: Secondary | ICD-10-CM | POA: Diagnosis not present

## 2021-04-22 DIAGNOSIS — D2272 Melanocytic nevi of left lower limb, including hip: Secondary | ICD-10-CM | POA: Diagnosis not present

## 2021-04-26 ENCOUNTER — Telehealth: Payer: Self-pay

## 2021-04-26 NOTE — Progress Notes (Signed)
Labs WNL, please inform pt. Albumin is mildly elevated, will check next year

## 2021-04-26 NOTE — Telephone Encounter (Signed)
LMOM to let pt know that labs were WNL and that albumin was slightly elevated and we will recheck next year

## 2021-04-26 NOTE — Telephone Encounter (Signed)
-----   Message from Lavera Guise, MD sent at 04/26/2021  8:12 AM EST ----- Labs WNL, please inform pt. Albumin is mildly elevated, will check next year

## 2021-05-27 DIAGNOSIS — Z1152 Encounter for screening for COVID-19: Secondary | ICD-10-CM | POA: Diagnosis not present

## 2021-07-07 DIAGNOSIS — M8588 Other specified disorders of bone density and structure, other site: Secondary | ICD-10-CM | POA: Diagnosis not present

## 2021-07-14 DIAGNOSIS — M810 Age-related osteoporosis without current pathological fracture: Secondary | ICD-10-CM | POA: Diagnosis not present

## 2021-07-14 DIAGNOSIS — E559 Vitamin D deficiency, unspecified: Secondary | ICD-10-CM | POA: Diagnosis not present

## 2021-09-14 ENCOUNTER — Telehealth: Payer: Self-pay

## 2021-09-14 NOTE — Telephone Encounter (Signed)
LMOM for pt to return call where pt had LMOM at lunch time.   ?

## 2021-09-16 ENCOUNTER — Encounter: Payer: Self-pay | Admitting: Nurse Practitioner

## 2021-09-16 ENCOUNTER — Ambulatory Visit (INDEPENDENT_AMBULATORY_CARE_PROVIDER_SITE_OTHER): Payer: PPO | Admitting: Nurse Practitioner

## 2021-09-16 VITALS — BP 140/80 | HR 81 | Temp 98.3°F | Resp 16 | Ht 65.0 in | Wt 122.2 lb

## 2021-09-16 DIAGNOSIS — R300 Dysuria: Secondary | ICD-10-CM

## 2021-09-16 DIAGNOSIS — M79672 Pain in left foot: Secondary | ICD-10-CM

## 2021-09-16 DIAGNOSIS — M79671 Pain in right foot: Secondary | ICD-10-CM | POA: Diagnosis not present

## 2021-09-16 DIAGNOSIS — E782 Mixed hyperlipidemia: Secondary | ICD-10-CM | POA: Diagnosis not present

## 2021-09-16 LAB — POCT URINALYSIS DIPSTICK
Bilirubin, UA: NEGATIVE
Glucose, UA: NEGATIVE
Ketones, UA: NEGATIVE
Leukocytes, UA: NEGATIVE
Nitrite, UA: NEGATIVE
Protein, UA: NEGATIVE
Spec Grav, UA: 1.01 (ref 1.010–1.025)
Urobilinogen, UA: 0.2 E.U./dL
pH, UA: 6 (ref 5.0–8.0)

## 2021-09-16 MED ORDER — ROSUVASTATIN CALCIUM 10 MG PO TABS: 10.0000 mg | ORAL_TABLET | Freq: Every day | ORAL | 3 refills | Status: DC

## 2021-09-16 NOTE — Progress Notes (Signed)
[facility] [place], [place] [address]  Internal MEDICINE  Office Visit Note  Patient Name: Maria Gonzalez  [id number]  [id number]  Date of Service: 09/16/2021  Chief Complaint  Patient presents with  . Acute Visit    Big toes feel tender near the nail and underneath   . Urinary Tract Infection    Outside of vaginal area was irritated, pt feels better now      HPI Emory presents for an acute sick visit for vaginal irritation which is resolved now. Possible UTI? Urinalysis done in office Bilateral great toes are tender on the nail and underneath, patient would like to see podiatry.  Refill of statin med Reviewed all supplements and vitamins that she is taking.    Current Medication:  Outpatient Encounter Medications as of 09/16/2021  Medication Sig Note  . acetaminophen (TYLENOL) 325 MG tablet Take 325 mg by mouth. 03/21/2016: Received from: [place]: Take 325 mg by mouth. Frequency:PRN   Dosage:325   MG  Instructions:  Note:Dose: '325MG'$   . aspirin EC 81 MG tablet Take 81 mg by mouth daily.   . Calcium Carbonate-Vit D-Min (CALCIUM 1200 PO) Take by mouth.   . Coenzyme Q10 (CO Q 10 PO) Take by mouth.   . Multiple Vitamin (MULTIVITAMIN) capsule Take 1 capsule by mouth daily.   . Multiple Vitamins-Minerals (ZINC PO) Take by mouth.   . pneumococcal 20-valent conjugate vaccine (PREVNAR 20) 0.5 ML injection Use as directed   . rosuvastatin (CRESTOR) 10 MG tablet Take 1 tablet (10 mg total) by mouth daily.   . Tdap (BOOSTRIX) 5-2.5-18.5 LF-MCG/0.5 injection Use as directed   . zolpidem (AMBIEN) 5 MG tablet Take 1 tablet (5 mg total) by mouth at bedtime as needed for sleep.    No facility-administered encounter medications on file as of 09/16/2021.      Medical History: Past Medical History:  Diagnosis Date  . Allergy   . Heart murmur   . History of chicken pox   . Hyperlipidemia   . Hypertension   . Mitral valve prolapse   . UTI  (urinary tract infection)      Vital Signs: BP (!) 154/76   Pulse 81   Temp 98.3 F (36.8 C)   Resp 16   Ht '5\' 5"'$  (1.651 m)   Wt 122 lb 3.2 oz (55.4 kg)   SpO2 98%   BMI 20.34 kg/m    Review of Systems  Constitutional:  Negative for chills, fatigue and unexpected weight change.  HENT:  Negative for congestion, rhinorrhea, sneezing and sore throat.   Eyes:  Negative for redness.  Respiratory:  Negative for cough, chest tightness and shortness of breath.   Cardiovascular:  Negative for chest pain and palpitations.  Gastrointestinal:  Negative for abdominal pain, constipation, diarrhea, nausea and vomiting.  Genitourinary:  Negative for dysuria and frequency.  Musculoskeletal:  Negative for arthralgias, back pain, joint swelling and neck pain.       Toe pain bilaterally.   Skin:  Negative for rash.  Neurological: Negative.  Negative for tremors and numbness.  Hematological:  Negative for adenopathy. Does not bruise/bleed easily.  Psychiatric/Behavioral:  Negative for behavioral problems (Depression), sleep disturbance and suicidal ideas. The patient is not nervous/anxious.     Physical Exam Vitals reviewed.  Constitutional:      General: She is not in acute distress.    Appearance: Normal appearance. She is normal weight. She is not ill-appearing.  HENT:  Head: Normocephalic and atraumatic.  Eyes:     Pupils: Pupils are equal, round, and reactive to light.  Cardiovascular:     Rate and Rhythm: Normal rate and regular rhythm.  Pulmonary:     Effort: Pulmonary effort is normal. No respiratory distress.  Musculoskeletal:     Right foot: Tenderness present.     Left foot: Tenderness present.  Neurological:     Mental Status: She is alert and oriented to person, place, and time.  Psychiatric:        Mood and Affect: Mood normal.        Behavior: Behavior normal.      Assessment/Plan:   General Counseling: Aubrina verbalizes understanding of the findings of todays  visit and agrees with plan of treatment. I have discussed any further diagnostic evaluation that may be needed or ordered today. We also reviewed her medications today. she has been encouraged to call the office with any questions or concerns that should arise related to todays visit.    Counseling:    Orders Placed This Encounter  Procedures  . CULTURE, URINE COMPREHENSIVE  . POCT Urinalysis Dipstick    No orders of the defined types were placed in this encounter.   No follow-ups on file.  [place] Controlled Substance Database was reviewed by me for overdose risk score (ORS)  Time spent:30 Minutes Time spent with patient included reviewing progress notes, labs, imaging studies, and discussing plan for follow up.   This patient was seen by Jonetta Osgood, FNP-C in collaboration with Dr. Clayborn Bigness as a part of collaborative care agreement.  Zaniah Titterington R. Valetta Fuller, MSN, FNP-C Internal Medicine

## 2021-09-21 LAB — CULTURE, URINE COMPREHENSIVE

## 2021-09-29 ENCOUNTER — Ambulatory Visit: Payer: PPO | Admitting: Podiatry

## 2021-09-29 ENCOUNTER — Encounter: Payer: Self-pay | Admitting: Podiatry

## 2021-09-29 DIAGNOSIS — G6289 Other specified polyneuropathies: Secondary | ICD-10-CM | POA: Diagnosis not present

## 2021-09-29 DIAGNOSIS — L608 Other nail disorders: Secondary | ICD-10-CM

## 2021-09-29 NOTE — Progress Notes (Signed)
  Subjective:  Patient ID: Maria Gonzalez, female    DOB: Sep 24, 1945,  MRN: [id number]  Chief Complaint  Patient presents with   Toe Pain    New pt- having pain in Left foot big toe area possible neuropathy    76 y.o. female presents with the above complaint. History confirmed with patient.  She has occasional tingling sensations in her toes from time to time, mostly at night.  She has had yearly infusions of Reclast for osteoporosis, her last one was in March.  She read that this could be a side effect of this.  Also inquires if shavings from an aluminum handicap ramp that she walks up at her house could have caused it.  She is concerned about some discoloration of the toes to  Objective:  Physical Exam: warm, good capillary refill, no trophic changes or ulcerative lesions, normal DP and PT pulses, normal monofilament exam, normal sensory exam, and some yellow discoloration does not appear to be grossly onychomycosis.  Assessment:   1. Other polyneuropathy   2. Nail discoloration      Plan:  Patient was evaluated and treated and all questions answered.  Discussed etiology and treatment options of different types of polyneuropathy for her.  She does not have diabetes or risk factors of other types of neuropathy.  I do not know if the Reclast is causing this but she should discuss this with her endocrinologist who prescribes it.  Does not think her B12 or folate has been checked so I gave her an order to have these checked.  We discussed the use of gabapentin she would prefer to hold off on medications for now as it is intermittently bothersome.  Will let me know if it worsens.  For the discoloration I was unable to clinically visualize any onychomycosis at this point.  Discussed with her an OTC antifungal may help such as Tolcylen.  I will see her back as needed.  Return if symptoms worsen or fail to improve.

## 2021-09-29 NOTE — Patient Instructions (Signed)
Tolcylen is an over the counter anti fungal that may improve your nail color and texture  Discuss with your endocrinologist if the Reclast infusions would cause neuropathy. It may improve with time after the last infusion   If your labs are abnormal I will let you know and can discuss supplementation with your PCP

## 2021-09-30 LAB — B12 AND FOLATE PANEL
Folate: >20 ng/mL (ref >3.0)
Vitamin B-12: 1317 pg/mL — ABNORMAL HIGH (ref 232–1245)

## 2021-11-14 ENCOUNTER — Encounter: Payer: Self-pay | Admitting: Nurse Practitioner

## 2021-11-27 DIAGNOSIS — L237 Allergic contact dermatitis due to plants, except food: Secondary | ICD-10-CM | POA: Diagnosis not present

## 2022-02-08 DIAGNOSIS — Z1231 Encounter for screening mammogram for malignant neoplasm of breast: Secondary | ICD-10-CM | POA: Diagnosis not present

## 2022-04-18 ENCOUNTER — Ambulatory Visit
Admission: RE | Admit: 2022-04-18 | Discharge: 2022-04-18 | Disposition: A | Discharge status: Home / Self Care | Payer: PPO | Attending: Internal Medicine | Admitting: Internal Medicine

## 2022-04-18 ENCOUNTER — Telehealth: Payer: Self-pay

## 2022-04-18 ENCOUNTER — Encounter: Payer: Self-pay | Admitting: Internal Medicine

## 2022-04-18 ENCOUNTER — Ambulatory Visit
Admission: RE | Admit: 2022-04-18 | Discharge: 2022-04-18 | Disposition: A | Discharge status: Home / Self Care | Payer: PPO | Source: Ambulatory Visit | Attending: Internal Medicine | Admitting: Internal Medicine

## 2022-04-18 ENCOUNTER — Ambulatory Visit (INDEPENDENT_AMBULATORY_CARE_PROVIDER_SITE_OTHER): Payer: PPO | Admitting: Internal Medicine

## 2022-04-18 VITALS — BP 165/83 | HR 92 | Temp 98.4°F | Resp 16 | Ht 65.0 in | Wt 120.8 lb

## 2022-04-18 DIAGNOSIS — M25561 Pain in right knee: Secondary | ICD-10-CM

## 2022-04-18 DIAGNOSIS — G4709 Other insomnia: Secondary | ICD-10-CM

## 2022-04-18 DIAGNOSIS — Z1211 Encounter for screening for malignant neoplasm of colon: Secondary | ICD-10-CM

## 2022-04-18 DIAGNOSIS — R6889 Other general symptoms and signs: Secondary | ICD-10-CM | POA: Diagnosis not present

## 2022-04-18 DIAGNOSIS — R300 Dysuria: Secondary | ICD-10-CM

## 2022-04-18 DIAGNOSIS — R030 Elevated blood-pressure reading, without diagnosis of hypertension: Secondary | ICD-10-CM

## 2022-04-18 DIAGNOSIS — E782 Mixed hyperlipidemia: Secondary | ICD-10-CM | POA: Diagnosis not present

## 2022-04-18 DIAGNOSIS — Z0001 Encounter for general adult medical examination with abnormal findings: Secondary | ICD-10-CM | POA: Diagnosis not present

## 2022-04-18 MED ORDER — MELOXICAM 7.5 MG PO TABS: ORAL_TABLET | ORAL | 0 refills | Status: DC

## 2022-04-18 MED ORDER — CLORAZEPATE DIPOTASSIUM 3.75 MG PO TABS: ORAL_TABLET | ORAL | 1 refills | Status: DC

## 2022-04-18 NOTE — Telephone Encounter (Signed)
Patient notified

## 2022-04-18 NOTE — Progress Notes (Signed)
[facility] [address] [place], [place] [address]  Internal MEDICINE  Office Visit Note  Patient Name: Maria Gonzalez  [id number]  [id number]  Date of Service: 04/19/2022  Chief Complaint  Patient presents with   Medicare Wellness   Hyperlipidemia   Hypertension   Quality Metric Gaps    TDAP, Pneumonia Vaccines     HPI Pt is here for routine health maintenance examination Patient had her third IV Reclast done and since then she has been having multiple aches and pains just not feeling well overall She is also complaining of problem getting up through a squatting position, her knee is hurting all the time she does not recall a trauma but does admit to working in the yard and after that her knee was swollen, she did not seek any help Continues to take her Crestor Blood pressure is elevated here in the office so she does not check blood pressure at home Denies any chest pain shortness of breath, has been under stress since her husband has been sick Due to the stress patient has not been sleeping on previous visit prescription of Ambien was given however patient never took that medication she wants something mild  Current Medication: Outpatient Encounter Medications as of 04/18/2022  Medication Sig Note   acetaminophen (TYLENOL) 325 MG tablet Take 325 mg by mouth. 03/21/2016: Received from: [facility] Received Sig: Take 325 mg by mouth. Frequency:PRN   Dosage:325   MG  Instructions:  Note:Dose: 325MG    aspirin EC 81 MG tablet Take 325 mg by mouth daily.    Calcium Carbonate-Vit D-Min (CALCIUM 1200 PO) Take by mouth.    clorazepate (TRANXENE) 3.75 MG tablet Take one tab po qhs for insomnia    Coenzyme Q10 (CO Q 10 PO) Take by mouth.    Magnesium 250 MG TABS Take 250 mg by mouth daily. Take once daily    meloxicam (MOBIC) 7.5 MG tablet Take one tab po bid with food    Multiple Vitamins-Minerals (WOMENS 50+ MULTI VITAMIN PO) Take 1 tablet by mouth. Taken once daily     Multiple Vitamins-Minerals (ZINC PO) Take by mouth.    pneumococcal 20-valent conjugate vaccine (PREVNAR 20) 0.5 ML injection Use as directed    rosuvastatin (CRESTOR) 10 MG tablet Take 1 tablet (10 mg total) by mouth daily.    Tdap (BOOSTRIX) 5-2.5-18.5 LF-MCG/0.5 injection Use as directed    TURMERIC CURCUMIN PO Take 1,500 mg by mouth daily. Taken once daily    [DISCONTINUED] Multiple Vitamin (MULTIVITAMIN) capsule Take 1 capsule by mouth daily.    [DISCONTINUED] zolpidem (AMBIEN) 5 MG tablet Take 1 tablet (5 mg total) by mouth at bedtime as needed for sleep.    No facility-administered encounter medications on file as of 04/18/2022.    Surgical History: Past Surgical History:  Procedure Laterality Date   calcification removal     CATARACT EXTRACTION     right breast biopsy  2013   right breast calcification s/p radiation   TONSILLECTOMY AND ADENOIDECTOMY      Medical History: Past Medical History:  Diagnosis Date   Allergy    Heart murmur    History of chicken pox    Hyperlipidemia    Hypertension    Mitral valve prolapse    UTI (urinary tract infection)     Family History: Family History  Problem Relation Age of Onset   Hypertension Mother    Stroke Mother    Heart disease Father  Cervical cancer Maternal Grandmother 60   Stroke Maternal Grandfather    Colon cancer Neg Hx     Social History: Social History   Socioeconomic History   Marital status: Married    Spouse name: Not on file   Number of children: Not on file   Years of education: Not on file   Highest education level: Not on file  Occupational History   Not on file  Tobacco Use   Smoking status: Never   Smokeless tobacco: Never  Substance and Sexual Activity   Alcohol use: No   Drug use: No   Sexual activity: Not on file  Other Topics Concern   Not on file  Social History Narrative   Lives in [place]   Married.   Cats.      Enjoys shopping, [place], [place].      Retired  [employer] for [employer].      Horticulture degree.    Social Determinants of Health   Financial Resource Strain: Not on file  Food Insecurity: Not on file  Transportation Needs: Not on file  Physical Activity: Not on file  Stress: Not on file  Social Connections: Not on file      Review of Systems  Constitutional:  Negative for chills, fatigue and unexpected weight change.  HENT:  Negative for congestion, postnasal drip, rhinorrhea, sneezing and sore throat.   Eyes:  Negative for redness.  Respiratory:  Negative for cough, chest tightness and shortness of breath.   Cardiovascular:  Negative for chest pain and palpitations.  Gastrointestinal:  Negative for abdominal pain, constipation, diarrhea, nausea and vomiting.  Genitourinary:  Negative for dysuria and frequency.  Musculoskeletal:  Positive for gait problem and joint swelling. Negative for arthralgias, back pain and neck pain.  Skin:  Negative for rash.  Neurological:  Positive for weakness. Negative for tremors and numbness.  Hematological:  Negative for adenopathy. Does not bruise/bleed easily.  Psychiatric/Behavioral:  Negative for behavioral problems (Depression), sleep disturbance and suicidal ideas. The patient is not nervous/anxious.      Vital Signs: BP (!) 165/83 Comment: 165/83  Pulse 92   Temp 98.4 F (36.9 C)   Resp 16   Ht 5\' 5"  (1.651 m)   Wt 120 lb 12.8 oz (54.8 kg)   SpO2 98%   BMI 20.10 kg/m    Physical Exam Constitutional:      General: She is not in acute distress.    Appearance: She is well-developed. She is not diaphoretic.  HENT:     Head: Normocephalic and atraumatic.     Right Ear: External ear normal.     Left Ear: External ear normal.     Nose: Nose normal.     Mouth/Throat:     Pharynx: No oropharyngeal exudate.  Eyes:     General: No scleral icterus.       Right eye: No discharge.        Left eye: No discharge.     Conjunctiva/sclera: Conjunctivae normal.     Pupils:  Pupils are equal, round, and reactive to light.  Neck:     Thyroid: No thyromegaly.     Vascular: No JVD.     Trachea: No tracheal deviation.  Cardiovascular:     Rate and Rhythm: Normal rate and regular rhythm.     Heart sounds: Normal heart sounds. No murmur heard.    No friction rub. No gallop.  Pulmonary:     Effort: Pulmonary effort is normal. No respiratory  distress.     Breath sounds: Normal breath sounds. No stridor. No wheezing or rales.  Chest:     Chest wall: No tenderness.  Abdominal:     General: Bowel sounds are normal. There is no distension.     Palpations: Abdomen is soft. There is no mass.     Tenderness: There is no abdominal tenderness. There is no guarding or rebound.  Musculoskeletal:        General: Swelling, tenderness and deformity present.     Cervical back: Normal range of motion and neck supple.     Comments: Right knee has effusion and restricted rom >>> left knee   Lymphadenopathy:     Cervical: No cervical adenopathy.  Skin:    General: Skin is warm and dry.     Coloration: Skin is not pale.     Findings: No erythema or rash.  Neurological:     Mental Status: She is alert.     Cranial Nerves: No cranial nerve deficit.     Motor: No abnormal muscle tone.     Coordination: Coordination normal.     Deep Tendon Reflexes: Reflexes are normal and symmetric.  Psychiatric:        Behavior: Behavior normal.        Thought Content: Thought content normal.        Judgment: Judgment normal.     Assessment/Plan: 1. Encounter for general adult medical examination with abnormal findings Patient is up-to-date on her mammogram and bone density, recently finished her diabetic last this was her third dose  2. Mixed hyperlipidemia Will hold her Crestor for couple of months to see her squatting improves, it is most likely related to her right knee injury will do another follow-up  3. Elevated blood pressure reading Patient is instructed to keep an eye on her  blood pressure and bring home monitor readings to next office visit so she can be treated appropriately - Lipid Panel With LDL/HDL Ratio  4. Difficulty maintaining squatting position This can be multifactorial, statin induced, right knee effusion fall, lack of activity will look into this - CBC with Differential/Platelet - TSH - T4, free - Comprehensive metabolic panel - CK (Creatine Kinase) - DG Knee Complete 4 Views Right; Future - Ambulatory referral to Orthopedic Surgery  5. Other insomnia Patient is under stress we will prescribe low-dose Tranxene as needed  6. Screening for colon cancer Cologuard is sent - Cologuard  7. Acute pain of right knee Patient has acute pain x-ray was abnormal patient was referred to orthopedic for further evaluation might need an MRI - DG Knee Complete 4 Views Right; Future - Ambulatory referral to Orthopedic Surgery  8. Dysuria - UA/M w/rflx Culture, Routine    General Counseling: Myles verbalizes understanding of the findings of todays visit and agrees with plan of treatment. I have discussed any further diagnostic evaluation that may be needed or ordered today. We also reviewed her medications today. she has been encouraged to call the office with any questions or concerns that should arise related to todays visit.    Counseling:  [place] Controlled Substance Database was reviewed by me.  Orders Placed This Encounter  Procedures   Microscopic Examination   DG Knee Complete 4 Views Right   UA/M w/rflx Culture, Routine   CBC with Differential/Platelet   Lipid Panel With LDL/HDL Ratio   TSH   T4, free   Comprehensive metabolic panel   CK (Creatine Kinase)   Cologuard   Ambulatory  referral to Orthopedic Surgery    Meds ordered this encounter  Medications   clorazepate (TRANXENE) 3.75 MG tablet    Sig: Take one tab po qhs for insomnia    Dispense:  30 tablet    Refill:  1   meloxicam (MOBIC) 7.5 MG tablet    Sig: Take one tab po bid  with food    Dispense:  60 tablet    Refill:  0    Total time spent:45 Minutes  Time spent includes review of chart, medications, test results, and follow up plan with the patient.     Lyndon Code, MD  Internal Medicine

## 2022-04-18 NOTE — Progress Notes (Signed)
Pt has abnormal Xray of the knee, she needs to see Orthopedics for possible MRI and further treatment, Referral ordered

## 2022-04-18 NOTE — Telephone Encounter (Signed)
Left voicemail for patient to give office a call back.

## 2022-04-18 NOTE — Telephone Encounter (Signed)
-----   Message from Lavera Guise, MD sent at 04/18/2022  3:42 PM EST ----- Pt has abnormal Xray of the knee, she needs to see Orthopedics for possible MRI and further treatment, Referral ordered

## 2022-04-19 ENCOUNTER — Telehealth: Payer: Self-pay | Admitting: Internal Medicine

## 2022-04-19 DIAGNOSIS — R030 Elevated blood-pressure reading, without diagnosis of hypertension: Secondary | ICD-10-CM | POA: Diagnosis not present

## 2022-04-19 DIAGNOSIS — R6889 Other general symptoms and signs: Secondary | ICD-10-CM | POA: Diagnosis not present

## 2022-04-19 LAB — MICROSCOPIC EXAMINATION
Bacteria, UA: NONE SEEN
Casts: NONE SEEN /lpf
Epithelial Cells (non renal): NONE SEEN /hpf (ref 0–10)
WBC, UA: NONE SEEN /hpf (ref 0–5)

## 2022-04-19 LAB — UA/M W/RFLX CULTURE, ROUTINE
Bilirubin, UA: NEGATIVE
Glucose, UA: NEGATIVE
Ketones, UA: NEGATIVE
Leukocytes,UA: NEGATIVE
Nitrite, UA: NEGATIVE
Protein,UA: NEGATIVE
RBC, UA: NEGATIVE
Specific Gravity, UA: 1.017 (ref 1.005–1.030)
Urobilinogen, Ur: 0.2 mg/dL (ref 0.2–1.0)
pH, UA: 6 (ref 5.0–7.5)

## 2022-04-19 NOTE — Telephone Encounter (Signed)
Orthopedic referral sent via [facility] to  [facility]

## 2022-04-20 LAB — COMPREHENSIVE METABOLIC PANEL
ALT: 20 IU/L (ref 0–32)
AST: 28 IU/L (ref 0–40)
Albumin/Globulin Ratio: 3 — ABNORMAL HIGH (ref 1.2–2.2)
Albumin: 4.8 g/dL (ref 3.8–4.8)
Alkaline Phosphatase: 60 IU/L (ref 44–121)
BUN/Creatinine Ratio: 23 (ref 12–28)
BUN: 16 mg/dL (ref 8–27)
Bilirubin Total: 0.3 mg/dL (ref 0.0–1.2)
CO2: 26 mmol/L (ref 20–29)
Calcium: 10.1 mg/dL (ref 8.7–10.3)
Chloride: 104 mmol/L (ref 96–106)
Creatinine, Ser: 0.71 mg/dL (ref 0.57–1.00)
Globulin, Total: 1.6 g/dL (ref 1.5–4.5)
Glucose: 78 mg/dL (ref 70–99)
Potassium: 4.2 mmol/L (ref 3.5–5.2)
Sodium: 145 mmol/L — ABNORMAL HIGH (ref 134–144)
Total Protein: 6.4 g/dL (ref 6.0–8.5)
eGFR: 89 mL/min/{1.73_m2} (ref >59)

## 2022-04-20 LAB — CBC WITH DIFFERENTIAL/PLATELET
Basophils Absolute: 0.1 10*3/uL (ref 0.0–0.2)
Basos: 1 %
EOS (ABSOLUTE): 0 10*3/uL (ref 0.0–0.4)
Eos: 1 %
Hematocrit: 45 % (ref 34.0–46.6)
Hemoglobin: 14.2 g/dL (ref 11.1–15.9)
Immature Grans (Abs): 0 10*3/uL (ref 0.0–0.1)
Immature Granulocytes: 0 %
Lymphocytes Absolute: 1.1 10*3/uL (ref 0.7–3.1)
Lymphs: 23 %
MCH: 30.6 pg (ref 26.6–33.0)
MCHC: 31.6 g/dL (ref 31.5–35.7)
MCV: 97 fL (ref 79–97)
Monocytes Absolute: 0.4 10*3/uL (ref 0.1–0.9)
Monocytes: 8 %
Neutrophils Absolute: 3.3 10*3/uL (ref 1.4–7.0)
Neutrophils: 67 %
Platelets: 177 10*3/uL (ref 150–450)
RBC: 4.64 x10E6/uL (ref 3.77–5.28)
RDW: 12.4 % (ref 11.7–15.4)
WBC: 4.9 10*3/uL (ref 3.4–10.8)

## 2022-04-20 LAB — CK: Total CK: 121 U/L (ref 32–182)

## 2022-04-20 LAB — TSH: TSH: 1.5 u[IU]/mL (ref 0.450–4.500)

## 2022-04-20 LAB — LIPID PANEL WITH LDL/HDL RATIO
Cholesterol, Total: 167 mg/dL (ref 100–199)
HDL: 56 mg/dL (ref >39)
LDL Chol Calc (NIH): 99 mg/dL (ref 0–99)
LDL/HDL Ratio: 1.8 ratio (ref 0.0–3.2)
Triglycerides: 58 mg/dL (ref 0–149)
VLDL Cholesterol Cal: 12 mg/dL (ref 5–40)

## 2022-04-20 LAB — T4, FREE: Free T4: 1.22 ng/dL (ref 0.82–1.77)

## 2022-04-25 ENCOUNTER — Telehealth: Payer: Self-pay | Admitting: Internal Medicine

## 2022-04-25 DIAGNOSIS — D2262 Melanocytic nevi of left upper limb, including shoulder: Secondary | ICD-10-CM | POA: Diagnosis not present

## 2022-04-25 DIAGNOSIS — D225 Melanocytic nevi of trunk: Secondary | ICD-10-CM | POA: Diagnosis not present

## 2022-04-25 DIAGNOSIS — D2272 Melanocytic nevi of left lower limb, including hip: Secondary | ICD-10-CM | POA: Diagnosis not present

## 2022-04-25 DIAGNOSIS — D2271 Melanocytic nevi of right lower limb, including hip: Secondary | ICD-10-CM | POA: Diagnosis not present

## 2022-04-25 DIAGNOSIS — B360 Pityriasis versicolor: Secondary | ICD-10-CM | POA: Diagnosis not present

## 2022-04-25 DIAGNOSIS — L538 Other specified erythematous conditions: Secondary | ICD-10-CM | POA: Diagnosis not present

## 2022-04-25 DIAGNOSIS — L820 Inflamed seborrheic keratosis: Secondary | ICD-10-CM | POA: Diagnosis not present

## 2022-04-25 DIAGNOSIS — M170 Bilateral primary osteoarthritis of knee: Secondary | ICD-10-CM | POA: Diagnosis not present

## 2022-04-25 DIAGNOSIS — D2261 Melanocytic nevi of right upper limb, including shoulder: Secondary | ICD-10-CM | POA: Diagnosis not present

## 2022-04-25 NOTE — Telephone Encounter (Signed)
Orthopedic Surgery appointment 04/25/22-Maria Gonzalez

## 2022-05-17 ENCOUNTER — Telehealth: Payer: Self-pay

## 2022-05-17 ENCOUNTER — Ambulatory Visit (INDEPENDENT_AMBULATORY_CARE_PROVIDER_SITE_OTHER): Payer: PPO | Admitting: Internal Medicine

## 2022-05-17 ENCOUNTER — Encounter: Payer: Self-pay | Admitting: Internal Medicine

## 2022-05-17 VITALS — BP 164/84 | HR 82 | Temp 98.3°F | Resp 16 | Ht 65.0 in | Wt 122.2 lb

## 2022-05-17 DIAGNOSIS — M25561 Pain in right knee: Secondary | ICD-10-CM | POA: Diagnosis not present

## 2022-05-17 DIAGNOSIS — E782 Mixed hyperlipidemia: Secondary | ICD-10-CM | POA: Diagnosis not present

## 2022-05-17 DIAGNOSIS — R030 Elevated blood-pressure reading, without diagnosis of hypertension: Secondary | ICD-10-CM

## 2022-05-17 NOTE — Progress Notes (Signed)
[facility] [place], [place] [address]  Internal MEDICINE  Office Visit Note  Patient Name: Maria Gonzalez  [id number]  [id number]  Date of Service: 06/02/2022  Chief Complaint  Patient presents with   Follow-up   Hyperlipidemia   Hypertension   Quality Metric Gaps    Pneumonia and TDAP vaccines    HPI Pt is here for routine follow up Bp is improving Knee is better on Mobic, did see ortho for abnormal knee Xray     Current Medication: Outpatient Encounter Medications as of 05/17/2022  Medication Sig Note   acetaminophen (TYLENOL) 325 MG tablet Take 325 mg by mouth. 03/21/2016: Received from: [facility]: Take 325 mg by mouth. Frequency:PRN   Dosage:325   MG  Instructions:  Note:Dose: '325MG'$    aspirin EC 81 MG tablet Take 325 mg by mouth daily.    Calcium Carbonate-Vit D-Min (CALCIUM 1200 PO) Take by mouth.    clorazepate (TRANXENE) 3.75 MG tablet Take one tab po qhs for insomnia    Coenzyme Q10 (CO Q 10 PO) Take by mouth.    Magnesium 250 MG TABS Take 250 mg by mouth daily. Take once daily    Multiple Vitamins-Minerals (WOMENS 50+ MULTI VITAMIN PO) Take 1 tablet by mouth. Taken once daily    Multiple Vitamins-Minerals (ZINC PO) Take by mouth.    pneumococcal 20-valent conjugate vaccine (PREVNAR 20) 0.5 ML injection Use as directed    rosuvastatin (CRESTOR) 10 MG tablet Take 1 tablet (10 mg total) by mouth daily.    Tdap (BOOSTRIX) 5-2.5-18.5 LF-MCG/0.5 injection Use as directed    TURMERIC CURCUMIN PO Take 1,500 mg by mouth daily. Taken once daily    [DISCONTINUED] meloxicam (MOBIC) 7.5 MG tablet Take one tab po bid with food    No facility-administered encounter medications on file as of 05/17/2022.    Surgical History: Past Surgical History:  Procedure Laterality Date   calcification removal     CATARACT EXTRACTION     right breast biopsy  2013   right breast calcification s/p radiation   TONSILLECTOMY AND ADENOIDECTOMY       Medical History: Past Medical History:  Diagnosis Date   Allergy    Heart murmur    History of chicken pox    Hyperlipidemia    Hypertension    Mitral valve prolapse    UTI (urinary tract infection)     Family History: Family History  Problem Relation Age of Onset   Hypertension Mother    Stroke Mother    Heart disease Father    Cervical cancer Maternal Grandmother 52   Stroke Maternal Grandfather    Colon cancer Neg Hx     Social History   Socioeconomic History   Marital status: Married    Spouse name: Not on file   Number of children: Not on file   Years of education: Not on file   Highest education level: Not on file  Occupational History   Not on file  Tobacco Use   Smoking status: Never   Smokeless tobacco: Never  Substance and Sexual Activity   Alcohol use: No   Drug use: No   Sexual activity: Not on file  Other Topics Concern   Not on file  Social History Narrative   Lives in [place]   Married.   Cats.      Enjoys shopping, [place], [place].      Retired [employer] for [employer]  Horticulture degree.    Social Determinants of Health   Financial Resource Strain: Not on file  Food Insecurity: Not on file  Transportation Needs: Not on file  Physical Activity: Not on file  Stress: Not on file  Social Connections: Not on file  Intimate Partner Violence: Not on file      Review of Systems  Constitutional:  Negative for fatigue and fever.  HENT:  Negative for congestion, mouth sores and postnasal drip.   Respiratory:  Negative for cough.   Cardiovascular:  Negative for chest pain.  Genitourinary:  Negative for flank pain.  Musculoskeletal:  Positive for arthralgias.  Psychiatric/Behavioral: Negative.      Vital Signs: BP (!) 164/84   Pulse 82   Temp 98.3 F (36.8 C)   Resp 16   Ht '5\' 5"'$  (1.651 m)   Wt 122 lb 3.2 oz (55.4 kg)   SpO2 97%   BMI 20.34 kg/m    Physical Exam Constitutional:      Appearance: Normal  appearance.  HENT:     Head: Normocephalic and atraumatic.     Nose: Nose normal.     Mouth/Throat:     Mouth: Mucous membranes are moist.     Pharynx: No posterior oropharyngeal erythema.  Eyes:     Extraocular Movements: Extraocular movements intact.     Pupils: Pupils are equal, round, and reactive to light.  Cardiovascular:     Pulses: Normal pulses.     Heart sounds: Normal heart sounds.  Pulmonary:     Effort: Pulmonary effort is normal.     Breath sounds: Normal breath sounds.  Neurological:     General: No focal deficit present.     Mental Status: She is alert.  Psychiatric:        Mood and Affect: Mood normal.        Behavior: Behavior normal.        Assessment/Plan: 1. Acute pain of right knee Improving, continue mobic prn   2. Mixed hyperlipidemia Restart Crestor   3. Elevated blood pressure reading Continue to monitor at home    General Counseling: Maria Gonzalez verbalizes understanding of the findings of todays visit and agrees with plan of treatment. I have discussed any further diagnostic evaluation that may be needed or ordered today. We also reviewed her medications today. she has been encouraged to call the office with any questions or concerns that should arise related to todays visit.    No orders of the defined types were placed in this encounter.   No orders of the defined types were placed in this encounter.   Total time spent:30 Minutes Time spent includes review of chart, medications, test results, and follow up plan with the patient.   [place] Controlled Substance Database was reviewed by me.   Dr Lavera Guise Internal medicine

## 2022-05-17 NOTE — Telephone Encounter (Signed)
Pt called back to confirm that she can continue her chol med as per dr Maria Gonzalez advised she can start chol med as prescribed crestor

## 2022-05-19 ENCOUNTER — Ambulatory Visit: Payer: PPO | Admitting: Internal Medicine

## 2022-05-24 ENCOUNTER — Ambulatory Visit: Payer: PPO | Admitting: Internal Medicine

## 2022-05-26 ENCOUNTER — Other Ambulatory Visit: Payer: Self-pay

## 2022-05-26 DIAGNOSIS — G4709 Other insomnia: Secondary | ICD-10-CM

## 2022-05-26 DIAGNOSIS — R030 Elevated blood-pressure reading, without diagnosis of hypertension: Secondary | ICD-10-CM

## 2022-05-26 DIAGNOSIS — M25561 Pain in right knee: Secondary | ICD-10-CM

## 2022-05-26 DIAGNOSIS — Z1211 Encounter for screening for malignant neoplasm of colon: Secondary | ICD-10-CM

## 2022-05-26 DIAGNOSIS — R6889 Other general symptoms and signs: Secondary | ICD-10-CM

## 2022-05-26 DIAGNOSIS — R300 Dysuria: Secondary | ICD-10-CM

## 2022-05-26 DIAGNOSIS — Z0001 Encounter for general adult medical examination with abnormal findings: Secondary | ICD-10-CM

## 2022-05-26 DIAGNOSIS — E782 Mixed hyperlipidemia: Secondary | ICD-10-CM

## 2022-05-26 MED ORDER — MELOXICAM 7.5 MG PO TABS: ORAL_TABLET | ORAL | 0 refills | Status: DC

## 2022-05-26 NOTE — Telephone Encounter (Signed)
Pt called for meloxicm [facility]

## 2022-08-23 ENCOUNTER — Other Ambulatory Visit: Payer: Self-pay | Admitting: Nurse Practitioner

## 2022-08-23 DIAGNOSIS — E782 Mixed hyperlipidemia: Secondary | ICD-10-CM

## 2022-09-14 DIAGNOSIS — M810 Age-related osteoporosis without current pathological fracture: Secondary | ICD-10-CM | POA: Diagnosis not present

## 2022-09-14 DIAGNOSIS — E559 Vitamin D deficiency, unspecified: Secondary | ICD-10-CM | POA: Diagnosis not present

## 2022-10-06 ENCOUNTER — Encounter: Payer: Self-pay | Admitting: Nurse Practitioner

## 2022-10-06 ENCOUNTER — Ambulatory Visit (INDEPENDENT_AMBULATORY_CARE_PROVIDER_SITE_OTHER): Payer: PPO | Admitting: Nurse Practitioner

## 2022-10-06 VITALS — BP 140/75 | HR 82 | Temp 98.4°F | Resp 16 | Ht 65.0 in | Wt 123.2 lb

## 2022-10-06 DIAGNOSIS — I10 Essential (primary) hypertension: Secondary | ICD-10-CM

## 2022-10-06 DIAGNOSIS — B3731 Acute candidiasis of vulva and vagina: Secondary | ICD-10-CM | POA: Diagnosis not present

## 2022-10-06 DIAGNOSIS — R030 Elevated blood-pressure reading, without diagnosis of hypertension: Secondary | ICD-10-CM

## 2022-10-06 MED ORDER — FLUCONAZOLE 150 MG PO TABS: 150.0000 mg | ORAL_TABLET | Freq: Once | ORAL | 0 refills | Status: AC

## 2022-10-06 NOTE — Progress Notes (Signed)
[facility] [address] [place], [place] [address]  Internal MEDICINE  Office Visit Note  Patient Name: Maria Gonzalez  [id number]  [id number]  Date of Service: 10/06/2022  Chief Complaint  Patient presents with   Acute Visit    Yeast infection--- bad smell.      HPI Maria Gonzalez presents for an acute sick visit for Possible yeast infection -- noted vaginal odor last week. Slight vaginal itching. No pain or vaginal discharge.       Current Medication:  Outpatient Encounter Medications as of 10/06/2022  Medication Sig Note   acetaminophen (TYLENOL) 325 MG tablet Take 325 mg by mouth. 03/21/2016: Received from: [facility] Received Sig: Take 325 mg by mouth. Frequency:PRN   Dosage:325   MG  Instructions:  Note:Dose: 325MG    aspirin EC 81 MG tablet Take 325 mg by mouth daily.    Calcium Carbonate-Vit D-Min (CALCIUM 1200 PO) Take by mouth.    clorazepate (TRANXENE) 3.75 MG tablet Take one tab po qhs for insomnia    Coenzyme Q10 (CO Q 10 PO) Take by mouth.    fluconazole (DIFLUCAN) 150 MG tablet Take 1 tablet (150 mg total) by mouth once for 1 dose. May take an additional dose after 3 days if still symptomatic.    Magnesium 250 MG TABS Take 250 mg by mouth daily. Take once daily    meloxicam (MOBIC) 7.5 MG tablet Take one tab po bid with food    Multiple Vitamins-Minerals (WOMENS 50+ MULTI VITAMIN PO) Take 1 tablet by mouth. Taken once daily    Multiple Vitamins-Minerals (ZINC PO) Take by mouth.    pneumococcal 20-valent conjugate vaccine (PREVNAR 20) 0.5 ML injection Use as directed    rosuvastatin (CRESTOR) 10 MG tablet TAKE 1 TABLET BY MOUTH DAILY    Tdap (BOOSTRIX) 5-2.5-18.5 LF-MCG/0.5 injection Use as directed    TURMERIC CURCUMIN PO Take 1,500 mg by mouth daily. Taken once daily    No facility-administered encounter medications on file as of 10/06/2022.      Medical History: Past Medical History:  Diagnosis Date   Allergy    Heart murmur    History of  chicken pox    Hyperlipidemia    Hypertension    Mitral valve prolapse    UTI (urinary tract infection)      Vital Signs: BP (!) 140/75 Comment: 160/88  Pulse 82   Temp 98.4 F (36.9 C)   Resp 16   Ht 5\' 5"  (1.651 m)   Wt 123 lb 3.2 oz (55.9 kg)   SpO2 95%   BMI 20.50 kg/m    Review of Systems  Constitutional:  Negative for chills, fatigue and unexpected weight change.  HENT:  Negative for congestion, postnasal drip, rhinorrhea, sneezing and sore throat.   Eyes:  Negative for redness.  Respiratory:  Negative for cough, chest tightness and shortness of breath.   Cardiovascular:  Negative for chest pain and palpitations.  Gastrointestinal:  Negative for abdominal pain, constipation, diarrhea, nausea and vomiting.  Genitourinary:  Positive for vaginal discharge (vaginal odor more sothan a discharge). Negative for dysuria and frequency.  Musculoskeletal:  Negative for arthralgias, back pain, joint swelling and neck pain.  Skin:  Negative for rash.  Neurological: Negative.  Negative for tremors and numbness.  Hematological:  Negative for adenopathy. Does not bruise/bleed easily.  Psychiatric/Behavioral:  Negative for behavioral problems (Depression), sleep disturbance and suicidal ideas. The patient is not nervous/anxious.     Physical Exam Vitals reviewed.  Constitutional:      General: She is not in acute distress.    Appearance: Normal appearance. She is normal weight. She is not ill-appearing.  HENT:     Head: Normocephalic and atraumatic.  Eyes:     Pupils: Pupils are equal, round, and reactive to light.  Cardiovascular:     Rate and Rhythm: Normal rate and regular rhythm.  Pulmonary:     Effort: Pulmonary effort is normal. No respiratory distress.  Neurological:     Mental Status: She is alert and oriented to person, place, and time.  Psychiatric:        Mood and Affect: Mood normal.        Behavior: Behavior normal.       Assessment/Plan: 1. Vulvovaginal  candidiasis Fluconazole ordered, take as prescribed.  - fluconazole (DIFLUCAN) 150 MG tablet; Take 1 tablet (150 mg total) by mouth once for 1 dose. May take an additional dose after 3 days if still symptomatic.  Dispense: 3 tablet; Refill: 0  2. White coat syndrome with diagnosis of hypertension Slightly elevated, BP normal at home. No intervention at this time.     General Counseling: Jahmiyah verbalizes understanding of the findings of todays visit and agrees with plan of treatment. I have discussed any further diagnostic evaluation that may be needed or ordered today. We also reviewed her medications today. she has been encouraged to call the office with any questions or concerns that should arise related to todays visit.    Counseling:    No orders of the defined types were placed in this encounter.   Meds ordered this encounter  Medications   fluconazole (DIFLUCAN) 150 MG tablet    Sig: Take 1 tablet (150 mg total) by mouth once for 1 dose. May take an additional dose after 3 days if still symptomatic.    Dispense:  3 tablet    Refill:  0    Return if symptoms worsen or fail to improve.  [place] Controlled Substance Database was reviewed by me for overdose risk score (ORS)  Time spent:20 Minutes Time spent with patient included reviewing progress notes, labs, imaging studies, and discussing plan for follow up.   This patient was seen by Sallyanne Kuster, FNP-C in collaboration with Dr. Beverely Risen as a part of collaborative care agreement.  Evoleht Hovatter R. Tedd Sias, MSN, FNP-C Internal Medicine

## 2022-10-11 DIAGNOSIS — H02831 Dermatochalasis of right upper eyelid: Secondary | ICD-10-CM | POA: Diagnosis not present

## 2022-10-11 DIAGNOSIS — D3140 Benign neoplasm of unspecified ciliary body: Secondary | ICD-10-CM | POA: Diagnosis not present

## 2022-10-11 DIAGNOSIS — H26493 Other secondary cataract, bilateral: Secondary | ICD-10-CM | POA: Diagnosis not present

## 2022-10-11 DIAGNOSIS — H35373 Puckering of macula, bilateral: Secondary | ICD-10-CM | POA: Diagnosis not present

## 2022-10-11 DIAGNOSIS — H02834 Dermatochalasis of left upper eyelid: Secondary | ICD-10-CM | POA: Diagnosis not present

## 2022-10-11 DIAGNOSIS — H16223 Keratoconjunctivitis sicca, not specified as Sjogren's, bilateral: Secondary | ICD-10-CM | POA: Diagnosis not present

## 2022-10-11 DIAGNOSIS — H524 Presbyopia: Secondary | ICD-10-CM | POA: Diagnosis not present

## 2022-10-12 ENCOUNTER — Encounter: Payer: Self-pay | Admitting: Nurse Practitioner

## 2023-02-10 DIAGNOSIS — Z1231 Encounter for screening mammogram for malignant neoplasm of breast: Secondary | ICD-10-CM | POA: Diagnosis not present

## 2023-02-27 DIAGNOSIS — L821 Other seborrheic keratosis: Secondary | ICD-10-CM | POA: Diagnosis not present

## 2023-02-27 DIAGNOSIS — D2261 Melanocytic nevi of right upper limb, including shoulder: Secondary | ICD-10-CM | POA: Diagnosis not present

## 2023-02-27 DIAGNOSIS — X32XXXA Exposure to sunlight, initial encounter: Secondary | ICD-10-CM | POA: Diagnosis not present

## 2023-02-27 DIAGNOSIS — L820 Inflamed seborrheic keratosis: Secondary | ICD-10-CM | POA: Diagnosis not present

## 2023-02-27 DIAGNOSIS — D2272 Melanocytic nevi of left lower limb, including hip: Secondary | ICD-10-CM | POA: Diagnosis not present

## 2023-02-27 DIAGNOSIS — D2271 Melanocytic nevi of right lower limb, including hip: Secondary | ICD-10-CM | POA: Diagnosis not present

## 2023-02-27 DIAGNOSIS — L538 Other specified erythematous conditions: Secondary | ICD-10-CM | POA: Diagnosis not present

## 2023-02-27 DIAGNOSIS — D2262 Melanocytic nevi of left upper limb, including shoulder: Secondary | ICD-10-CM | POA: Diagnosis not present

## 2023-02-27 DIAGNOSIS — L570 Actinic keratosis: Secondary | ICD-10-CM | POA: Diagnosis not present

## 2023-02-27 DIAGNOSIS — D225 Melanocytic nevi of trunk: Secondary | ICD-10-CM | POA: Diagnosis not present

## 2023-04-24 ENCOUNTER — Encounter: Payer: Self-pay | Admitting: Physician Assistant

## 2023-04-24 ENCOUNTER — Ambulatory Visit (INDEPENDENT_AMBULATORY_CARE_PROVIDER_SITE_OTHER): Payer: PPO | Admitting: Physician Assistant

## 2023-04-24 VITALS — BP 130/80 | HR 96 | Temp 97.6°F | Resp 16 | Ht 65.0 in | Wt 123.0 lb

## 2023-04-24 DIAGNOSIS — Z23 Encounter for immunization: Secondary | ICD-10-CM

## 2023-04-24 DIAGNOSIS — R5383 Other fatigue: Secondary | ICD-10-CM

## 2023-04-24 DIAGNOSIS — R300 Dysuria: Secondary | ICD-10-CM | POA: Diagnosis not present

## 2023-04-24 DIAGNOSIS — Z0000 Encounter for general adult medical examination without abnormal findings: Secondary | ICD-10-CM | POA: Diagnosis not present

## 2023-04-24 DIAGNOSIS — Z1329 Encounter for screening for other suspected endocrine disorder: Secondary | ICD-10-CM | POA: Diagnosis not present

## 2023-04-24 DIAGNOSIS — E782 Mixed hyperlipidemia: Secondary | ICD-10-CM | POA: Diagnosis not present

## 2023-04-24 MED ORDER — FLUCONAZOLE 150 MG PO TABS: 150.0000 mg | ORAL_TABLET | Freq: Once | ORAL | 0 refills | Status: AC

## 2023-04-24 MED ORDER — PNEUMOCOCCAL 20-VAL CONJ VACC 0.5 ML IM SUSY: PREFILLED_SYRINGE | INTRAMUSCULAR | 0 refills | Status: AC

## 2023-04-24 MED ORDER — TETANUS-DIPHTH-ACELL PERTUSSIS 5-2.5-18.5 LF-MCG/0.5 IM SUSP: INTRAMUSCULAR | 0 refills | Status: AC

## 2023-04-24 NOTE — Progress Notes (Cosign Needed)
[facility] [address] [place], [place] [address]  Internal MEDICINE  Office Visit Note  Patient Name: Maria Gonzalez  [id number]  [id number]  Date of Service: 05/09/2023  Chief Complaint  Patient presents with   Medicare Wellness   Hyperlipidemia   Hypertension   Quality Metric Gaps    TDAP and Pneumonia vaccines    HPI Somaly presents for an annual well visit Well-appearing 77 y.o.female Routine mammogram: Done in October 2024 DEXA scan: Done in 2020 Labs: labs ordered Other concerns: unclear if prevnar 20 done and will send order--pt will check with pharmacy,due for tdap as well -pt reports she is doing well and has no concerns today     04/24/2023    3:17 PM 04/18/2022    1:49 PM 03/24/2020   10:34 AM  MMSE - Mini Mental State Exam  Orientation to time 5 5 5   Orientation to Place 5 5 5   Registration 3 3 3   Attention/ Calculation 5 5 5   Recall 3 3 3   Language- name 2 objects 2 2 2   Language- repeat 1 1 1   Language- follow 3 step command 3 3 3   Language- read & follow direction 1 1 1   Write a sentence 1 1 1   Copy design 1 1 1   Total score 30 30 30     Functional Status Survey: Is the patient deaf or have difficulty hearing?: No Does the patient have difficulty seeing, even when wearing glasses/contacts?: No Does the patient have difficulty concentrating, remembering, or making decisions?: No Does the patient have difficulty walking or climbing stairs?: No Does the patient have difficulty dressing or bathing?: No Does the patient have difficulty doing errands alone such as visiting a doctor's office or shopping?: No     03/24/2020   10:32 AM 02/12/2021    9:08 AM 04/13/2021    9:39 AM 04/18/2022    1:48 PM 04/24/2023    3:17 PM  Fall Risk  Falls in the past year? 0 0 0 0 0  (RETIRED) Patient Fall Risk Level  Low fall risk     Patient at Risk for Falls Due to No Fall Risks No Fall Risks     Fall risk Follow up Falls evaluation completed Falls  evaluation completed          04/24/2023    3:17 PM  Depression screen PHQ 2/9  Decreased Interest 0  Down, Depressed, Hopeless 0  PHQ - 2 Score 0        No data to display            Current Medication: Outpatient Encounter Medications as of 04/24/2023  Medication Sig Note   acetaminophen (TYLENOL) 325 MG tablet Take 325 mg by mouth. 03/21/2016: Received from: [facility] Received Sig: Take 325 mg by mouth. Frequency:PRN   Dosage:325   MG  Instructions:  Note:Dose: 325MG    aspirin EC 81 MG tablet Take 325 mg by mouth daily.    Calcium Carbonate-Vit D-Min (CALCIUM 1200 PO) Take by mouth.    Coenzyme Q10 (CO Q 10 PO) Take by mouth.    [EXPIRED] fluconazole (DIFLUCAN) 150 MG tablet Take 1 tablet (150 mg total) by mouth once for 1 dose. May repeat after 3 days if symptoms persist    Magnesium 250 MG TABS Take 250 mg by mouth daily. Take once daily    Multiple Vitamins-Minerals (WOMENS 50+ MULTI VITAMIN PO) Take 1 tablet by mouth. Taken once daily    Multiple Vitamins-Minerals (  ZINC PO) Take by mouth.    rosuvastatin (CRESTOR) 10 MG tablet TAKE 1 TABLET BY MOUTH DAILY    TURMERIC CURCUMIN PO Take 1,500 mg by mouth daily. Taken once daily    [DISCONTINUED] clorazepate (TRANXENE) 3.75 MG tablet Take one tab po qhs for insomnia    [DISCONTINUED] meloxicam (MOBIC) 7.5 MG tablet Take one tab po bid with food    [DISCONTINUED] pneumococcal 20-valent conjugate vaccine (PREVNAR 20) 0.5 ML injection Use as directed    [DISCONTINUED] Tdap (BOOSTRIX) 5-2.5-18.5 LF-MCG/0.5 injection Use as directed    pneumococcal 20-valent conjugate vaccine (PREVNAR 20) 0.5 ML injection Use as directed    Tdap (BOOSTRIX) 5-2.5-18.5 LF-MCG/0.5 injection Use as directed    No facility-administered encounter medications on file as of 04/24/2023.    Surgical History: Past Surgical History:  Procedure Laterality Date   calcification removal     CATARACT EXTRACTION     right breast biopsy  2013    right breast calcification s/p radiation   TONSILLECTOMY AND ADENOIDECTOMY      Medical History: Past Medical History:  Diagnosis Date   Allergy    Heart murmur    History of chicken pox    Hyperlipidemia    Hypertension    Mitral valve prolapse    UTI (urinary tract infection)     Family History: Family History  Problem Relation Age of Onset   Hypertension Mother    Stroke Mother    Heart disease Father    Cervical cancer Maternal Grandmother 36   Stroke Maternal Grandfather    Colon cancer Neg Hx     Social History   Socioeconomic History   Marital status: Married    Spouse name: Not on file   Number of children: Not on file   Years of education: Not on file   Highest education level: Not on file  Occupational History   Not on file  Tobacco Use   Smoking status: Never   Smokeless tobacco: Never  Substance and Sexual Activity   Alcohol use: No   Drug use: No   Sexual activity: Not on file  Other Topics Concern   Not on file  Social History Narrative   Lives in [place]   Married.   Cats.      Enjoys shopping, [place], [place].      Retired [employer] for [employer].      Horticulture degree.    Social Drivers of Corporate investment banker Strain: Not on file  Food Insecurity: Not on file  Transportation Needs: Not on file  Physical Activity: Not on file  Stress: Not on file  Social Connections: Not on file  Intimate Partner Violence: Not on file      Review of Systems  Constitutional:  Negative for chills, fatigue and unexpected weight change.  HENT:  Negative for congestion, rhinorrhea, sneezing and sore throat.   Eyes:  Negative for redness.  Respiratory:  Negative for cough, chest tightness and shortness of breath.   Cardiovascular:  Negative for chest pain and palpitations.  Gastrointestinal:  Negative for abdominal pain, constipation, diarrhea, nausea and vomiting.  Genitourinary:  Negative for dysuria and frequency.   Musculoskeletal:  Negative for arthralgias, back pain, joint swelling and neck pain.  Skin:  Negative for rash.  Neurological: Negative.  Negative for tremors and numbness.  Hematological:  Negative for adenopathy. Does not bruise/bleed easily.  Psychiatric/Behavioral:  Negative for behavioral problems (Depression), sleep disturbance and suicidal ideas. The patient is  not nervous/anxious.     Vital Signs: BP 130/80   Pulse 96   Temp 97.6 F (36.4 C)   Resp 16   Ht 5\' 5"  (1.651 m)   Wt 123 lb (55.8 kg)   SpO2 96%   BMI 20.47 kg/m    Physical Exam Vitals reviewed.  Constitutional:      General: She is not in acute distress.    Appearance: Normal appearance. She is normal weight. She is not ill-appearing.  HENT:     Head: Normocephalic and atraumatic.  Eyes:     Pupils: Pupils are equal, round, and reactive to light.  Cardiovascular:     Rate and Rhythm: Normal rate and regular rhythm.  Pulmonary:     Effort: Pulmonary effort is normal. No respiratory distress.  Neurological:     Mental Status: She is alert and oriented to person, place, and time.  Psychiatric:        Mood and Affect: Mood normal.        Behavior: Behavior normal.        Assessment/Plan: 1. Encounter for Medicare annual wellness exam (Primary) AWV performed, labs ordered, due for labs and vaccines otherwise UTD on PHM  2. Mixed hyperlipidemia Will check labs - Lipid Panel With LDL/HDL Ratio  3. Thyroid disorder screen - TSH + free T4  4. Need for vaccination - pneumococcal 20-valent conjugate vaccine (PREVNAR 20) 0.5 ML injection; Use as directed  Dispense: 0.5 mL; Refill: 0 - Tdap (BOOSTRIX) 5-2.5-18.5 LF-MCG/0.5 injection; Use as directed  Dispense: 0.5 mL; Refill: 0  5. Other fatigue - CBC w/Diff/Platelet - Comprehensive metabolic panel - TSH + free T4 - Lipid Panel With LDL/HDL Ratio  6. Dysuria - UA/M w/rflx Culture, Routine - Microscopic Examination     General Counseling:  Yarah verbalizes understanding of the findings of todays visit and agrees with plan of treatment. I have discussed any further diagnostic evaluation that may be needed or ordered today. We also reviewed her medications today. she has been encouraged to call the office with any questions or concerns that should arise related to todays visit.    Orders Placed This Encounter  Procedures   Microscopic Examination   CBC w/Diff/Platelet   Comprehensive metabolic panel   TSH + free T4   Lipid Panel With LDL/HDL Ratio   UA/M w/rflx Culture, Routine    Meds ordered this encounter  Medications   pneumococcal 20-valent conjugate vaccine (PREVNAR 20) 0.5 ML injection    Sig: Use as directed    Dispense:  0.5 mL    Refill:  0   Tdap (BOOSTRIX) 5-2.5-18.5 LF-MCG/0.5 injection    Sig: Use as directed    Dispense:  0.5 mL    Refill:  0   fluconazole (DIFLUCAN) 150 MG tablet    Sig: Take 1 tablet (150 mg total) by mouth once for 1 dose. May repeat after 3 days if symptoms persist    Dispense:  3 tablet    Refill:  0    Return in about 1 year (around 04/23/2024) for CPE.   Total time spent:35 Minutes Time spent includes review of chart, medications, test results, and follow up plan with the patient.   [place] Controlled Substance Database was reviewed by me.  This patient was seen by Lynn Ito, PA-C in collaboration with Dr. Beverely Risen as a part of collaborative care agreement.  Lynn Ito, PA-C Internal medicine

## 2023-04-25 LAB — MICROSCOPIC EXAMINATION

## 2023-04-26 LAB — UA/M W/RFLX CULTURE, ROUTINE
Glucose, UA: NEGATIVE
Leukocytes,UA: NEGATIVE
Nitrite, UA: NEGATIVE
Protein,UA: NEGATIVE
RBC, UA: NEGATIVE
Specific Gravity, UA: 1.025 (ref 1.005–1.030)
Urobilinogen, Ur: 0.2 mg/dL (ref 0.2–1.0)
pH, UA: 5.5 (ref 5.0–7.5)

## 2023-04-26 LAB — MICROSCOPIC EXAMINATION

## 2023-05-01 ENCOUNTER — Telehealth: Payer: Self-pay

## 2023-05-01 DIAGNOSIS — R300 Dysuria: Secondary | ICD-10-CM

## 2023-05-01 NOTE — Telephone Encounter (Signed)
-----   Message from Carlean Jews sent at 04/28/2023  1:26 PM EST ----- Please let her know she had some blood seen in urine without any signs of UTI. We can have her do repeat urine microscopic to lab (order if so) vs go ahead with [place] of kidneys/bladder to evaluate this further.

## 2023-05-01 NOTE — Telephone Encounter (Signed)
Spoke with patient regarding urine culture. Sending repeat urine culture to [facility] for patient to do. She does not want to complete a U/S at this time.

## 2023-05-12 ENCOUNTER — Telehealth: Payer: Self-pay | Admitting: Internal Medicine

## 2023-05-12 DIAGNOSIS — E782 Mixed hyperlipidemia: Secondary | ICD-10-CM | POA: Diagnosis not present

## 2023-05-12 DIAGNOSIS — R5383 Other fatigue: Secondary | ICD-10-CM | POA: Diagnosis not present

## 2023-05-12 DIAGNOSIS — R300 Dysuria: Secondary | ICD-10-CM | POA: Diagnosis not present

## 2023-05-12 DIAGNOSIS — Z1329 Encounter for screening for other suspected endocrine disorder: Secondary | ICD-10-CM | POA: Diagnosis not present

## 2023-05-13 LAB — LIPID PANEL WITH LDL/HDL RATIO
Cholesterol, Total: 180 mg/dL (ref 100–199)
HDL: 58 mg/dL (ref >39)
LDL Chol Calc (NIH): 106 mg/dL — ABNORMAL HIGH (ref 0–99)
LDL/HDL Ratio: 1.8 {ratio} (ref 0.0–3.2)
Triglycerides: 87 mg/dL (ref 0–149)
VLDL Cholesterol Cal: 16 mg/dL (ref 5–40)

## 2023-05-13 LAB — CBC WITH DIFFERENTIAL/PLATELET
Basophils Absolute: 0.1 10*3/uL (ref 0.0–0.2)
Basos: 1 %
EOS (ABSOLUTE): 0.1 10*3/uL (ref 0.0–0.4)
Eos: 2 %
Hematocrit: 46.2 % (ref 34.0–46.6)
Hemoglobin: 14.8 g/dL (ref 11.1–15.9)
Immature Grans (Abs): 0 10*3/uL (ref 0.0–0.1)
Immature Granulocytes: 0 %
Lymphocytes Absolute: 1.4 10*3/uL (ref 0.7–3.1)
Lymphs: 27 %
MCH: 31 pg (ref 26.6–33.0)
MCHC: 32 g/dL (ref 31.5–35.7)
MCV: 97 fL (ref 79–97)
Monocytes Absolute: 0.5 10*3/uL (ref 0.1–0.9)
Monocytes: 9 %
Neutrophils Absolute: 3.1 10*3/uL (ref 1.4–7.0)
Neutrophils: 61 %
Platelets: 165 10*3/uL (ref 150–450)
RBC: 4.77 x10E6/uL (ref 3.77–5.28)
RDW: 12.4 % (ref 11.7–15.4)
WBC: 5.1 10*3/uL (ref 3.4–10.8)

## 2023-05-13 LAB — COMPREHENSIVE METABOLIC PANEL
ALT: 23 [IU]/L (ref 0–32)
AST: 30 [IU]/L (ref 0–40)
Albumin: 4.7 g/dL (ref 3.8–4.8)
Alkaline Phosphatase: 64 [IU]/L (ref 44–121)
BUN/Creatinine Ratio: 23 (ref 12–28)
BUN: 17 mg/dL (ref 8–27)
Bilirubin Total: 0.6 mg/dL (ref 0.0–1.2)
CO2: 24 mmol/L (ref 20–29)
Calcium: 9.8 mg/dL (ref 8.7–10.3)
Chloride: 103 mmol/L (ref 96–106)
Creatinine, Ser: 0.75 mg/dL (ref 0.57–1.00)
Globulin, Total: 1.7 g/dL (ref 1.5–4.5)
Glucose: 87 mg/dL (ref 70–99)
Potassium: 4 mmol/L (ref 3.5–5.2)
Sodium: 142 mmol/L (ref 134–144)
Total Protein: 6.4 g/dL (ref 6.0–8.5)
eGFR: 82 mL/min/{1.73_m2} (ref >59)

## 2023-05-13 LAB — URINALYSIS, ROUTINE W REFLEX MICROSCOPIC
Bilirubin, UA: NEGATIVE
Glucose, UA: NEGATIVE
Ketones, UA: NEGATIVE
Leukocytes,UA: NEGATIVE
Nitrite, UA: NEGATIVE
Protein,UA: NEGATIVE
RBC, UA: NEGATIVE
Specific Gravity, UA: 1.02 (ref 1.005–1.030)
Urobilinogen, Ur: 0.2 mg/dL (ref 0.2–1.0)
pH, UA: 7 (ref 5.0–7.5)

## 2023-05-13 LAB — TSH+FREE T4
Free T4: 1.28 ng/dL (ref 0.82–1.77)
TSH: 2.93 u[IU]/mL (ref 0.450–4.500)

## 2023-05-15 ENCOUNTER — Telehealth: Payer: Self-pay

## 2023-05-15 ENCOUNTER — Other Ambulatory Visit: Payer: Self-pay

## 2023-05-15 DIAGNOSIS — Z1211 Encounter for screening for malignant neoplasm of colon: Secondary | ICD-10-CM

## 2023-05-15 NOTE — Telephone Encounter (Signed)
Spoke with patient regarding labs.

## 2023-05-26 DIAGNOSIS — Z1211 Encounter for screening for malignant neoplasm of colon: Secondary | ICD-10-CM | POA: Diagnosis not present

## 2023-05-31 NOTE — Telephone Encounter (Signed)
 error

## 2023-06-02 ENCOUNTER — Telehealth: Payer: Self-pay

## 2023-06-02 LAB — COLOGUARD: COLOGUARD: NEGATIVE

## 2023-06-02 NOTE — Telephone Encounter (Signed)
Spoke with patient regarding Cologuard.

## 2023-06-02 NOTE — Telephone Encounter (Signed)
-----   Message from Carlean Jews sent at 06/02/2023 10:40 AM EST ----- Please let her know that cologuard was negative

## 2023-06-12 ENCOUNTER — Telehealth: Payer: Self-pay | Admitting: Internal Medicine

## 2023-06-12 NOTE — Telephone Encounter (Signed)
Patient lvm during lunch wanting to know how we sent claim for cologuard. I lvm for patient to call cologuard and that we do not bill for it-Toni

## 2023-07-13 DIAGNOSIS — Z961 Presence of intraocular lens: Secondary | ICD-10-CM | POA: Diagnosis not present

## 2023-07-13 DIAGNOSIS — H04123 Dry eye syndrome of bilateral lacrimal glands: Secondary | ICD-10-CM | POA: Diagnosis not present

## 2023-09-13 DIAGNOSIS — M8588 Other specified disorders of bone density and structure, other site: Secondary | ICD-10-CM | POA: Diagnosis not present

## 2023-09-14 ENCOUNTER — Other Ambulatory Visit: Payer: Self-pay | Admitting: Internal Medicine

## 2023-09-14 DIAGNOSIS — E782 Mixed hyperlipidemia: Secondary | ICD-10-CM

## 2023-09-20 DIAGNOSIS — M810 Age-related osteoporosis without current pathological fracture: Secondary | ICD-10-CM | POA: Diagnosis not present

## 2023-09-20 DIAGNOSIS — E559 Vitamin D deficiency, unspecified: Secondary | ICD-10-CM | POA: Diagnosis not present

## 2023-09-27 DIAGNOSIS — X58XXXA Exposure to other specified factors, initial encounter: Secondary | ICD-10-CM | POA: Diagnosis not present

## 2023-09-27 DIAGNOSIS — I10 Essential (primary) hypertension: Secondary | ICD-10-CM | POA: Diagnosis not present

## 2023-09-27 DIAGNOSIS — S76212A Strain of adductor muscle, fascia and tendon of left thigh, initial encounter: Secondary | ICD-10-CM | POA: Diagnosis not present

## 2023-09-27 DIAGNOSIS — R0981 Nasal congestion: Secondary | ICD-10-CM | POA: Diagnosis not present

## 2023-09-27 DIAGNOSIS — R350 Frequency of micturition: Secondary | ICD-10-CM | POA: Diagnosis not present

## 2023-09-27 DIAGNOSIS — J302 Other seasonal allergic rhinitis: Secondary | ICD-10-CM | POA: Diagnosis not present

## 2023-09-27 DIAGNOSIS — R8290 Unspecified abnormal findings in urine: Secondary | ICD-10-CM | POA: Diagnosis not present

## 2023-09-27 DIAGNOSIS — I160 Hypertensive urgency: Secondary | ICD-10-CM | POA: Diagnosis not present

## 2023-10-23 DIAGNOSIS — Z87898 Personal history of other specified conditions: Secondary | ICD-10-CM | POA: Diagnosis not present

## 2023-10-23 DIAGNOSIS — Z7689 Persons encountering health services in other specified circumstances: Secondary | ICD-10-CM | POA: Diagnosis not present

## 2023-10-23 DIAGNOSIS — M8580 Other specified disorders of bone density and structure, unspecified site: Secondary | ICD-10-CM | POA: Diagnosis not present

## 2023-10-23 DIAGNOSIS — I700 Atherosclerosis of aorta: Secondary | ICD-10-CM | POA: Diagnosis not present

## 2023-10-23 DIAGNOSIS — I341 Nonrheumatic mitral (valve) prolapse: Secondary | ICD-10-CM | POA: Diagnosis not present

## 2023-10-23 DIAGNOSIS — R8290 Unspecified abnormal findings in urine: Secondary | ICD-10-CM | POA: Diagnosis not present

## 2023-10-23 DIAGNOSIS — I2510 Atherosclerotic heart disease of native coronary artery without angina pectoris: Secondary | ICD-10-CM | POA: Diagnosis not present

## 2023-10-23 DIAGNOSIS — Z1331 Encounter for screening for depression: Secondary | ICD-10-CM | POA: Diagnosis not present

## 2023-10-30 DIAGNOSIS — I700 Atherosclerosis of aorta: Secondary | ICD-10-CM | POA: Diagnosis not present

## 2023-10-30 DIAGNOSIS — E559 Vitamin D deficiency, unspecified: Secondary | ICD-10-CM | POA: Diagnosis not present

## 2023-10-30 DIAGNOSIS — R8290 Unspecified abnormal findings in urine: Secondary | ICD-10-CM | POA: Diagnosis not present

## 2023-11-01 DIAGNOSIS — H04123 Dry eye syndrome of bilateral lacrimal glands: Secondary | ICD-10-CM | POA: Diagnosis not present

## 2023-11-08 DIAGNOSIS — H0100A Unspecified blepharitis right eye, upper and lower eyelids: Secondary | ICD-10-CM | POA: Diagnosis not present

## 2023-11-08 DIAGNOSIS — H04123 Dry eye syndrome of bilateral lacrimal glands: Secondary | ICD-10-CM | POA: Diagnosis not present

## 2024-01-16 DIAGNOSIS — H04123 Dry eye syndrome of bilateral lacrimal glands: Secondary | ICD-10-CM | POA: Diagnosis not present

## 2024-03-28 NOTE — Progress Notes (Signed)
 Maria Gonzalez                                          MRN: [id number]   03/28/2024   The [facility] Quality Team Specialist reviewed this patient medical record for the purposes of chart review for care gap closure. The following were reviewed: chart review for care gap closure-controlling blood pressure.    [facility] Quality Team

## 2024-04-25 ENCOUNTER — Ambulatory Visit: Payer: PPO | Admitting: Physician Assistant

## 2024-06-17 ENCOUNTER — Ambulatory Visit: Admitting: Podiatry
